# Patient Record
Sex: Male | Born: 1955 | Race: White | Hispanic: No | Smoking: Current every day smoker
Health system: Southern US, Community
[De-identification: ages and names within clinical notes are randomized; demographics above are authoritative.]

## PROBLEM LIST (undated history)

## (undated) DIAGNOSIS — F1910 Other psychoactive substance abuse, uncomplicated: Secondary | ICD-10-CM

## (undated) DIAGNOSIS — F329 Major depressive disorder, single episode, unspecified: Secondary | ICD-10-CM

## (undated) DIAGNOSIS — E785 Hyperlipidemia, unspecified: Secondary | ICD-10-CM

## (undated) DIAGNOSIS — K769 Liver disease, unspecified: Secondary | ICD-10-CM

## (undated) DIAGNOSIS — F1410 Cocaine abuse, uncomplicated: Secondary | ICD-10-CM

## (undated) DIAGNOSIS — I259 Chronic ischemic heart disease, unspecified: Secondary | ICD-10-CM

## (undated) DIAGNOSIS — F419 Anxiety disorder, unspecified: Secondary | ICD-10-CM

## (undated) DIAGNOSIS — Z765 Malingerer [conscious simulation]: Secondary | ICD-10-CM

## (undated) HISTORY — PX: PELVIS CLOSED REDUCTION: SHX1023

## (undated) HISTORY — PX: INNER EAR SURGERY: SHX679

## (undated) HISTORY — PX: WRIST SURGERY: SHX841

## (undated) HISTORY — PX: APPENDECTOMY: SHX54

## (undated) HISTORY — DX: Anxiety disorder, unspecified: F41.9

## (undated) HISTORY — PX: ABDOMINAL SURGERY: SHX537

## (undated) HISTORY — PX: HIP SURGERY: SHX245

---

## 2000-06-01 ENCOUNTER — Emergency Department (HOSPITAL_COMMUNITY): Admission: EM | Admit: 2000-06-01 | Discharge: 2000-06-01 | Payer: Self-pay | Admitting: Unknown Physician Specialty

## 2004-12-03 ENCOUNTER — Ambulatory Visit: Payer: Self-pay | Admitting: Family Medicine

## 2004-12-24 ENCOUNTER — Ambulatory Visit: Payer: Self-pay | Admitting: Family Medicine

## 2005-01-08 ENCOUNTER — Ambulatory Visit: Payer: Self-pay | Admitting: Family Medicine

## 2005-01-18 ENCOUNTER — Ambulatory Visit: Payer: Self-pay | Admitting: Family Medicine

## 2005-02-16 ENCOUNTER — Ambulatory Visit: Payer: Self-pay | Admitting: Family Medicine

## 2005-04-07 ENCOUNTER — Ambulatory Visit: Payer: Self-pay | Admitting: Family Medicine

## 2005-04-21 ENCOUNTER — Ambulatory Visit: Payer: Self-pay | Admitting: Family Medicine

## 2005-05-04 ENCOUNTER — Ambulatory Visit: Payer: Self-pay | Admitting: Family Medicine

## 2005-06-15 ENCOUNTER — Ambulatory Visit: Payer: Self-pay | Admitting: Family Medicine

## 2005-08-16 ENCOUNTER — Ambulatory Visit: Payer: Self-pay | Admitting: Family Medicine

## 2005-08-26 ENCOUNTER — Ambulatory Visit: Payer: Self-pay | Admitting: Family Medicine

## 2005-09-09 ENCOUNTER — Ambulatory Visit: Payer: Self-pay | Admitting: Family Medicine

## 2005-11-17 ENCOUNTER — Ambulatory Visit: Payer: Self-pay | Admitting: Family Medicine

## 2005-12-06 ENCOUNTER — Ambulatory Visit: Payer: Self-pay | Admitting: Family Medicine

## 2006-01-04 DIAGNOSIS — F17200 Nicotine dependence, unspecified, uncomplicated: Secondary | ICD-10-CM

## 2006-01-04 DIAGNOSIS — F259 Schizoaffective disorder, unspecified: Secondary | ICD-10-CM | POA: Insufficient documentation

## 2006-01-04 DIAGNOSIS — K5730 Diverticulosis of large intestine without perforation or abscess without bleeding: Secondary | ICD-10-CM | POA: Insufficient documentation

## 2006-01-04 DIAGNOSIS — K219 Gastro-esophageal reflux disease without esophagitis: Secondary | ICD-10-CM

## 2006-01-04 DIAGNOSIS — B1710 Acute hepatitis C without hepatic coma: Secondary | ICD-10-CM | POA: Insufficient documentation

## 2006-01-04 DIAGNOSIS — M479 Spondylosis, unspecified: Secondary | ICD-10-CM | POA: Insufficient documentation

## 2006-01-04 DIAGNOSIS — F29 Unspecified psychosis not due to a substance or known physiological condition: Secondary | ICD-10-CM | POA: Insufficient documentation

## 2006-01-04 DIAGNOSIS — F411 Generalized anxiety disorder: Secondary | ICD-10-CM | POA: Insufficient documentation

## 2006-01-04 DIAGNOSIS — E7800 Pure hypercholesterolemia, unspecified: Secondary | ICD-10-CM | POA: Insufficient documentation

## 2006-01-12 ENCOUNTER — Ambulatory Visit (HOSPITAL_COMMUNITY): Payer: Self-pay | Admitting: Psychiatry

## 2006-01-12 ENCOUNTER — Ambulatory Visit: Payer: Self-pay | Admitting: Family Medicine

## 2006-01-26 ENCOUNTER — Encounter: Payer: Self-pay | Admitting: Family Medicine

## 2006-02-03 ENCOUNTER — Encounter: Payer: Self-pay | Admitting: Family Medicine

## 2006-02-07 ENCOUNTER — Encounter: Payer: Self-pay | Admitting: Family Medicine

## 2006-02-24 ENCOUNTER — Encounter: Payer: Self-pay | Admitting: Family Medicine

## 2006-03-03 ENCOUNTER — Ambulatory Visit: Payer: Self-pay | Admitting: Family Medicine

## 2006-03-03 LAB — CONVERTED CEMR LAB: Blood Glucose, Fasting: 140 mg/dL

## 2006-04-01 ENCOUNTER — Ambulatory Visit: Payer: Self-pay | Admitting: Family Medicine

## 2006-04-01 LAB — CONVERTED CEMR LAB: Blood Glucose, Fasting: 110 mg/dL

## 2006-04-06 ENCOUNTER — Ambulatory Visit (HOSPITAL_COMMUNITY): Payer: Self-pay | Admitting: Psychiatry

## 2006-05-06 ENCOUNTER — Ambulatory Visit: Payer: Self-pay | Admitting: Family Medicine

## 2006-05-11 ENCOUNTER — Ambulatory Visit: Payer: Self-pay | Admitting: Family Medicine

## 2006-05-19 ENCOUNTER — Ambulatory Visit: Payer: Self-pay | Admitting: Family Medicine

## 2006-06-01 ENCOUNTER — Ambulatory Visit: Payer: Self-pay | Admitting: Family Medicine

## 2006-06-01 DIAGNOSIS — R498 Other voice and resonance disorders: Secondary | ICD-10-CM

## 2006-06-02 LAB — CONVERTED CEMR LAB
ALT: 30 units/L (ref 0–53)
AST: 25 units/L (ref 0–37)
Alkaline Phosphatase: 119 units/L — ABNORMAL HIGH (ref 39–117)
LDL Cholesterol: 95 mg/dL (ref 0–99)
MCHC: 31.2 g/dL (ref 30.0–36.0)
MCV: 95 fL (ref 78.0–100.0)
Platelets: 287 10*3/uL (ref 150–400)
Sodium: 139 meq/L (ref 135–145)
Total Bilirubin: 0.4 mg/dL (ref 0.3–1.2)
Total Protein: 7 g/dL (ref 6.0–8.3)
VLDL: 33 mg/dL (ref 0–40)

## 2006-06-08 ENCOUNTER — Telehealth: Payer: Self-pay | Admitting: Family Medicine

## 2006-06-16 ENCOUNTER — Encounter: Payer: Self-pay | Admitting: Family Medicine

## 2006-07-06 ENCOUNTER — Ambulatory Visit (HOSPITAL_COMMUNITY): Payer: Self-pay | Admitting: Psychiatry

## 2006-07-20 ENCOUNTER — Encounter: Payer: Self-pay | Admitting: Family Medicine

## 2006-07-26 ENCOUNTER — Ambulatory Visit: Payer: Self-pay | Admitting: Family Medicine

## 2006-08-05 ENCOUNTER — Ambulatory Visit (HOSPITAL_COMMUNITY): Payer: Self-pay | Admitting: Psychiatry

## 2006-08-29 ENCOUNTER — Encounter: Payer: Self-pay | Admitting: Family Medicine

## 2006-09-05 ENCOUNTER — Ambulatory Visit: Payer: Self-pay | Admitting: Family Medicine

## 2006-10-26 ENCOUNTER — Ambulatory Visit (HOSPITAL_COMMUNITY): Payer: Self-pay | Admitting: Psychiatry

## 2006-11-14 ENCOUNTER — Ambulatory Visit: Payer: Self-pay | Admitting: Family Medicine

## 2006-11-23 ENCOUNTER — Ambulatory Visit (HOSPITAL_COMMUNITY): Payer: Self-pay | Admitting: Psychiatry

## 2006-12-07 ENCOUNTER — Encounter: Payer: Self-pay | Admitting: Family Medicine

## 2006-12-13 ENCOUNTER — Ambulatory Visit: Payer: Self-pay | Admitting: Family Medicine

## 2006-12-14 ENCOUNTER — Encounter: Payer: Self-pay | Admitting: Family Medicine

## 2006-12-14 LAB — CONVERTED CEMR LAB
ALT: 11 units/L (ref 0–53)
AST: 12 units/L (ref 0–37)
Albumin: 4.3 g/dL (ref 3.5–5.2)
Alkaline Phosphatase: 118 units/L — ABNORMAL HIGH (ref 39–117)
BUN: 10 mg/dL (ref 6–23)
CO2: 19 meq/L (ref 19–32)
Calcium: 9.2 mg/dL (ref 8.4–10.5)
Chloride: 108 meq/L (ref 96–112)
Cholesterol: 140 mg/dL (ref 0–200)
Creatinine, Ser: 1.23 mg/dL (ref 0.40–1.50)
Glucose, Bld: 110 mg/dL — ABNORMAL HIGH (ref 70–99)
HCT: 40.9 % (ref 39.0–52.0)
HCV Quantitative: 80 intl units/mL — ABNORMAL HIGH (ref <5)
HDL: 29 mg/dL — ABNORMAL LOW (ref >39)
Hemoglobin: 13.5 g/dL (ref 13.0–17.0)
Hgb A1c MFr Bld: 5.9 % (ref 4.6–6.1)
LDL Cholesterol: 57 mg/dL (ref 0–99)
MCHC: 33 g/dL (ref 30.0–36.0)
MCV: 92.3 fL (ref 78.0–100.0)
Platelets: 226 10*3/uL (ref 150–400)
Potassium: 4 meq/L (ref 3.5–5.3)
RBC: 4.43 M/uL (ref 4.22–5.81)
RDW: 15.3 % — ABNORMAL HIGH (ref 11.5–14.0)
Sodium: 140 meq/L (ref 135–145)
Total Bilirubin: 0.3 mg/dL (ref 0.3–1.2)
Total CHOL/HDL Ratio: 4.8
Total Protein: 6.9 g/dL (ref 6.0–8.3)
Triglycerides: 271 mg/dL — ABNORMAL HIGH (ref <150)
VLDL: 54 mg/dL — ABNORMAL HIGH (ref 0–40)
WBC: 6 10*3/uL (ref 4.0–10.5)

## 2006-12-19 ENCOUNTER — Ambulatory Visit (HOSPITAL_COMMUNITY): Payer: Self-pay | Admitting: Psychiatry

## 2006-12-21 ENCOUNTER — Encounter: Payer: Self-pay | Admitting: Family Medicine

## 2006-12-29 ENCOUNTER — Ambulatory Visit: Payer: Self-pay | Admitting: Family Medicine

## 2007-01-24 ENCOUNTER — Ambulatory Visit (HOSPITAL_COMMUNITY): Payer: Self-pay | Admitting: Psychiatry

## 2007-02-07 ENCOUNTER — Ambulatory Visit: Payer: Self-pay | Admitting: Family Medicine

## 2007-02-27 ENCOUNTER — Ambulatory Visit (HOSPITAL_COMMUNITY): Payer: Self-pay | Admitting: Psychiatry

## 2007-04-06 ENCOUNTER — Ambulatory Visit (HOSPITAL_COMMUNITY): Payer: Self-pay | Admitting: Psychiatry

## 2007-04-07 ENCOUNTER — Encounter: Payer: Self-pay | Admitting: Family Medicine

## 2007-05-15 ENCOUNTER — Encounter: Admission: RE | Admit: 2007-05-15 | Discharge: 2007-05-15 | Payer: Self-pay | Admitting: Family Medicine

## 2007-05-15 ENCOUNTER — Ambulatory Visit: Payer: Self-pay | Admitting: Family Medicine

## 2007-05-15 LAB — CONVERTED CEMR LAB
Bilirubin Urine: NEGATIVE
Ketones, urine, test strip: NEGATIVE
Specific Gravity, Urine: 1.025
Urobilinogen, UA: 0.2
pH: 6

## 2007-05-16 ENCOUNTER — Encounter: Payer: Self-pay | Admitting: Family Medicine

## 2007-05-16 LAB — CONVERTED CEMR LAB
CO2: 17 meq/L — ABNORMAL LOW (ref 19–32)
Glucose, Bld: 74 mg/dL (ref 70–99)
Lymphocytes Relative: 38 % (ref 12–46)
Lymphs Abs: 5.8 10*3/uL — ABNORMAL HIGH (ref 0.7–4.0)
Monocytes Relative: 6 % (ref 3–12)
Neutro Abs: 8.5 10*3/uL — ABNORMAL HIGH (ref 1.7–7.7)
Neutrophils Relative %: 55 % (ref 43–77)
Potassium: 4.5 meq/L (ref 3.5–5.3)
RBC: 4.19 M/uL — ABNORMAL LOW (ref 4.22–5.81)
Sodium: 140 meq/L (ref 135–145)
WBC: 15.4 10*3/uL — ABNORMAL HIGH (ref 4.0–10.5)

## 2007-06-15 ENCOUNTER — Encounter: Payer: Self-pay | Admitting: Family Medicine

## 2007-06-16 ENCOUNTER — Ambulatory Visit (HOSPITAL_COMMUNITY): Payer: Self-pay | Admitting: Psychiatry

## 2007-06-16 ENCOUNTER — Telehealth: Payer: Self-pay | Admitting: Family Medicine

## 2007-06-20 ENCOUNTER — Encounter: Payer: Self-pay | Admitting: Family Medicine

## 2007-06-20 LAB — CONVERTED CEMR LAB
ALT: 19 units/L (ref 0–53)
AST: 19 units/L (ref 0–37)
HDL: 36 mg/dL — ABNORMAL LOW (ref >39)
Total CHOL/HDL Ratio: 7.2
VLDL: 77 mg/dL — ABNORMAL HIGH (ref 0–40)

## 2007-06-21 ENCOUNTER — Encounter: Payer: Self-pay | Admitting: Family Medicine

## 2007-06-26 ENCOUNTER — Ambulatory Visit: Payer: Self-pay | Admitting: Family Medicine

## 2007-06-29 ENCOUNTER — Encounter: Payer: Self-pay | Admitting: Family Medicine

## 2007-07-21 ENCOUNTER — Telehealth: Payer: Self-pay | Admitting: Family Medicine

## 2007-08-04 ENCOUNTER — Encounter: Payer: Self-pay | Admitting: Family Medicine

## 2007-08-07 ENCOUNTER — Telehealth: Payer: Self-pay | Admitting: Family Medicine

## 2007-08-08 ENCOUNTER — Telehealth: Payer: Self-pay | Admitting: Family Medicine

## 2007-08-09 ENCOUNTER — Telehealth: Payer: Self-pay | Admitting: Family Medicine

## 2007-08-10 ENCOUNTER — Encounter: Payer: Self-pay | Admitting: Family Medicine

## 2007-08-15 ENCOUNTER — Ambulatory Visit: Payer: Self-pay | Admitting: Family Medicine

## 2007-08-15 DIAGNOSIS — Z87898 Personal history of other specified conditions: Secondary | ICD-10-CM

## 2007-08-15 LAB — CONVERTED CEMR LAB
Blood in Urine, dipstick: NEGATIVE
Nitrite: NEGATIVE
Specific Gravity, Urine: >=1.03
Urobilinogen, UA: 0.2
WBC Urine, dipstick: NEGATIVE

## 2007-08-16 ENCOUNTER — Encounter: Payer: Self-pay | Admitting: Family Medicine

## 2007-08-16 LAB — CONVERTED CEMR LAB
CO2: 20 meq/L (ref 19–32)
Chloride: 105 meq/L (ref 96–112)
Creatinine, Ser: 1.05 mg/dL (ref 0.40–1.50)
PSA, Free Pct: 7 — ABNORMAL LOW (ref >25)
Potassium: 4.4 meq/L (ref 3.5–5.3)

## 2007-08-23 ENCOUNTER — Telehealth: Payer: Self-pay | Admitting: Family Medicine

## 2007-08-23 DIAGNOSIS — M79609 Pain in unspecified limb: Secondary | ICD-10-CM | POA: Insufficient documentation

## 2007-08-30 ENCOUNTER — Telehealth: Payer: Self-pay | Admitting: Family Medicine

## 2007-09-08 ENCOUNTER — Ambulatory Visit: Payer: Self-pay | Admitting: Physical Medicine & Rehabilitation

## 2007-09-13 ENCOUNTER — Ambulatory Visit (HOSPITAL_COMMUNITY): Payer: Self-pay | Admitting: Psychiatry

## 2007-09-15 ENCOUNTER — Encounter: Payer: Self-pay | Admitting: Family Medicine

## 2007-10-24 ENCOUNTER — Ambulatory Visit: Payer: Self-pay | Admitting: Family Medicine

## 2007-10-24 DIAGNOSIS — H9190 Unspecified hearing loss, unspecified ear: Secondary | ICD-10-CM | POA: Insufficient documentation

## 2007-10-31 ENCOUNTER — Encounter: Payer: Self-pay | Admitting: Family Medicine

## 2007-11-02 LAB — CONVERTED CEMR LAB
Glucose, Fasting: 83 mg/dL (ref 70–99)
HDL: 36 mg/dL — ABNORMAL LOW (ref >39)
LDL Cholesterol: 116 mg/dL — ABNORMAL HIGH (ref 0–99)
Total CHOL/HDL Ratio: 5.6
Triglycerides: 245 mg/dL — ABNORMAL HIGH (ref <150)
VLDL: 49 mg/dL — ABNORMAL HIGH (ref 0–40)

## 2007-11-06 ENCOUNTER — Telehealth (INDEPENDENT_AMBULATORY_CARE_PROVIDER_SITE_OTHER): Payer: Self-pay | Admitting: *Deleted

## 2007-11-07 ENCOUNTER — Telehealth (INDEPENDENT_AMBULATORY_CARE_PROVIDER_SITE_OTHER): Payer: Self-pay | Admitting: *Deleted

## 2007-11-09 ENCOUNTER — Encounter: Payer: Self-pay | Admitting: Family Medicine

## 2007-11-14 ENCOUNTER — Telehealth: Payer: Self-pay | Admitting: Family Medicine

## 2007-11-14 ENCOUNTER — Encounter: Payer: Self-pay | Admitting: Family Medicine

## 2007-11-16 ENCOUNTER — Ambulatory Visit: Payer: Self-pay | Admitting: Family Medicine

## 2007-11-16 ENCOUNTER — Encounter: Payer: Self-pay | Admitting: Family Medicine

## 2007-11-16 ENCOUNTER — Encounter: Admission: RE | Admit: 2007-11-16 | Discharge: 2007-11-16 | Payer: Self-pay | Admitting: Family Medicine

## 2007-11-16 DIAGNOSIS — R0602 Shortness of breath: Secondary | ICD-10-CM

## 2007-11-30 ENCOUNTER — Encounter: Payer: Self-pay | Admitting: Family Medicine

## 2007-12-01 ENCOUNTER — Encounter: Payer: Self-pay | Admitting: Family Medicine

## 2007-12-06 ENCOUNTER — Encounter: Payer: Self-pay | Admitting: Family Medicine

## 2007-12-13 ENCOUNTER — Ambulatory Visit (HOSPITAL_COMMUNITY): Payer: Self-pay | Admitting: Psychiatry

## 2007-12-21 ENCOUNTER — Ambulatory Visit: Payer: Self-pay | Admitting: Family Medicine

## 2007-12-22 ENCOUNTER — Telehealth: Payer: Self-pay | Admitting: Family Medicine

## 2008-01-05 ENCOUNTER — Ambulatory Visit: Payer: Self-pay | Admitting: Family Medicine

## 2008-01-05 DIAGNOSIS — F528 Other sexual dysfunction not due to a substance or known physiological condition: Secondary | ICD-10-CM | POA: Insufficient documentation

## 2008-01-08 ENCOUNTER — Telehealth: Payer: Self-pay | Admitting: Family Medicine

## 2008-01-12 ENCOUNTER — Telehealth: Payer: Self-pay | Admitting: Family Medicine

## 2008-01-12 ENCOUNTER — Encounter: Payer: Self-pay | Admitting: Family Medicine

## 2008-01-29 ENCOUNTER — Ambulatory Visit: Payer: Self-pay | Admitting: Family Medicine

## 2008-02-08 ENCOUNTER — Ambulatory Visit: Payer: Self-pay | Admitting: Family Medicine

## 2008-03-01 ENCOUNTER — Encounter: Payer: Self-pay | Admitting: Family Medicine

## 2008-03-08 ENCOUNTER — Ambulatory Visit (HOSPITAL_COMMUNITY): Payer: Self-pay | Admitting: Psychiatry

## 2008-04-03 ENCOUNTER — Ambulatory Visit (HOSPITAL_COMMUNITY): Payer: Self-pay | Admitting: Psychiatry

## 2008-04-03 ENCOUNTER — Telehealth: Payer: Self-pay | Admitting: Family Medicine

## 2008-05-26 ENCOUNTER — Emergency Department (HOSPITAL_BASED_OUTPATIENT_CLINIC_OR_DEPARTMENT_OTHER): Admission: EM | Admit: 2008-05-26 | Discharge: 2008-05-26 | Payer: Self-pay | Admitting: Emergency Medicine

## 2008-05-29 ENCOUNTER — Ambulatory Visit: Payer: Self-pay | Admitting: Family Medicine

## 2008-05-29 DIAGNOSIS — R7301 Impaired fasting glucose: Secondary | ICD-10-CM

## 2008-05-29 LAB — CONVERTED CEMR LAB

## 2008-06-05 ENCOUNTER — Encounter: Payer: Self-pay | Admitting: Family Medicine

## 2008-06-07 ENCOUNTER — Ambulatory Visit (HOSPITAL_COMMUNITY): Payer: Self-pay | Admitting: Psychiatry

## 2008-06-24 ENCOUNTER — Encounter: Payer: Self-pay | Admitting: Family Medicine

## 2008-08-29 ENCOUNTER — Telehealth: Payer: Self-pay | Admitting: Family Medicine

## 2008-08-29 ENCOUNTER — Ambulatory Visit: Payer: Self-pay | Admitting: Family Medicine

## 2008-08-29 DIAGNOSIS — K3189 Other diseases of stomach and duodenum: Secondary | ICD-10-CM

## 2008-08-29 DIAGNOSIS — R1013 Epigastric pain: Secondary | ICD-10-CM

## 2008-08-29 LAB — CONVERTED CEMR LAB: Blood Glucose, Fingerstick: 92

## 2008-08-30 LAB — CONVERTED CEMR LAB
ALT: 24 units/L (ref 0–53)
Albumin: 4.2 g/dL (ref 3.5–5.2)
CO2: 21 meq/L (ref 19–32)
Calcium: 9.2 mg/dL (ref 8.4–10.5)
Chloride: 104 meq/L (ref 96–112)
Glucose, Bld: 95 mg/dL (ref 70–99)
Potassium: 4.2 meq/L (ref 3.5–5.3)
Sodium: 137 meq/L (ref 135–145)
Total Protein: 6.8 g/dL (ref 6.0–8.3)

## 2008-09-13 ENCOUNTER — Ambulatory Visit (HOSPITAL_COMMUNITY): Payer: Self-pay | Admitting: Psychiatry

## 2008-09-24 ENCOUNTER — Ambulatory Visit: Payer: Self-pay | Admitting: Family Medicine

## 2008-09-24 DIAGNOSIS — D649 Anemia, unspecified: Secondary | ICD-10-CM | POA: Insufficient documentation

## 2008-09-24 LAB — CONVERTED CEMR LAB: Blood Glucose, AC Bkfst: 151 mg/dL

## 2008-09-25 LAB — CONVERTED CEMR LAB
Hemoglobin: 12.6 g/dL — ABNORMAL LOW (ref 13.0–17.0)
MCHC: 32.6 g/dL (ref 30.0–36.0)
Platelets: 326 10*3/uL (ref 150–400)
RDW: 15.7 % — ABNORMAL HIGH (ref 11.5–15.5)

## 2008-10-02 ENCOUNTER — Telehealth: Payer: Self-pay | Admitting: Family Medicine

## 2008-11-08 ENCOUNTER — Ambulatory Visit: Payer: Self-pay | Admitting: Family Medicine

## 2008-11-08 DIAGNOSIS — B009 Herpesviral infection, unspecified: Secondary | ICD-10-CM | POA: Insufficient documentation

## 2008-11-21 ENCOUNTER — Encounter: Admission: RE | Admit: 2008-11-21 | Discharge: 2008-11-21 | Payer: Self-pay | Admitting: Family Medicine

## 2008-11-21 ENCOUNTER — Ambulatory Visit: Payer: Self-pay | Admitting: Family Medicine

## 2008-11-21 DIAGNOSIS — M25569 Pain in unspecified knee: Secondary | ICD-10-CM

## 2008-11-25 ENCOUNTER — Telehealth: Payer: Self-pay | Admitting: Family Medicine

## 2008-12-08 ENCOUNTER — Encounter: Payer: Self-pay | Admitting: Family Medicine

## 2008-12-13 ENCOUNTER — Ambulatory Visit (HOSPITAL_COMMUNITY): Payer: Self-pay | Admitting: Psychiatry

## 2009-01-13 ENCOUNTER — Emergency Department (HOSPITAL_BASED_OUTPATIENT_CLINIC_OR_DEPARTMENT_OTHER): Admission: EM | Admit: 2009-01-13 | Discharge: 2009-01-13 | Payer: Self-pay | Admitting: Emergency Medicine

## 2009-01-13 ENCOUNTER — Ambulatory Visit: Payer: Self-pay | Admitting: Occupational Medicine

## 2009-01-13 ENCOUNTER — Ambulatory Visit: Payer: Self-pay | Admitting: Radiology

## 2009-01-13 ENCOUNTER — Ambulatory Visit (HOSPITAL_COMMUNITY): Payer: Self-pay | Admitting: Psychiatry

## 2009-01-16 ENCOUNTER — Telehealth (INDEPENDENT_AMBULATORY_CARE_PROVIDER_SITE_OTHER): Payer: Self-pay | Admitting: *Deleted

## 2009-02-14 ENCOUNTER — Ambulatory Visit: Payer: Self-pay | Admitting: Family Medicine

## 2009-02-14 DIAGNOSIS — I959 Hypotension, unspecified: Secondary | ICD-10-CM | POA: Insufficient documentation

## 2009-02-18 ENCOUNTER — Ambulatory Visit: Payer: Self-pay | Admitting: Family Medicine

## 2009-02-18 DIAGNOSIS — S62639A Displaced fracture of distal phalanx of unspecified finger, initial encounter for closed fracture: Secondary | ICD-10-CM

## 2009-02-25 ENCOUNTER — Encounter: Payer: Self-pay | Admitting: Family Medicine

## 2009-02-26 LAB — CONVERTED CEMR LAB
ALT: 20 units/L (ref 0–53)
Albumin: 4.4 g/dL (ref 3.5–5.2)
Alkaline Phosphatase: 73 units/L (ref 39–117)
CO2: 22 meq/L (ref 19–32)
Cholesterol: 159 mg/dL (ref 0–200)
Glucose, Bld: 98 mg/dL (ref 70–99)
Hgb A1c MFr Bld: 5.9 % (ref 4.6–6.1)
LDL Cholesterol: 97 mg/dL (ref 0–99)
MCV: 95.1 fL (ref 78.0–100.0)
Platelets: 250 10*3/uL (ref 150–400)
Potassium: 4.1 meq/L (ref 3.5–5.3)
RBC: 4.26 M/uL (ref 4.22–5.81)
Sodium: 140 meq/L (ref 135–145)
Total Bilirubin: 0.4 mg/dL (ref 0.3–1.2)
Total Protein: 6.8 g/dL (ref 6.0–8.3)
VLDL: 23 mg/dL (ref 0–40)
WBC: 10.4 10*3/uL (ref 4.0–10.5)

## 2009-03-07 ENCOUNTER — Ambulatory Visit (HOSPITAL_COMMUNITY): Payer: Self-pay | Admitting: Psychiatry

## 2009-03-20 ENCOUNTER — Emergency Department (HOSPITAL_BASED_OUTPATIENT_CLINIC_OR_DEPARTMENT_OTHER): Admission: EM | Admit: 2009-03-20 | Discharge: 2009-03-20 | Payer: Self-pay | Admitting: Emergency Medicine

## 2009-03-20 ENCOUNTER — Ambulatory Visit: Payer: Self-pay | Admitting: Diagnostic Radiology

## 2009-03-23 ENCOUNTER — Emergency Department (HOSPITAL_BASED_OUTPATIENT_CLINIC_OR_DEPARTMENT_OTHER): Admission: EM | Admit: 2009-03-23 | Discharge: 2009-03-23 | Payer: Self-pay | Admitting: Emergency Medicine

## 2009-03-24 ENCOUNTER — Telehealth (INDEPENDENT_AMBULATORY_CARE_PROVIDER_SITE_OTHER): Payer: Self-pay | Admitting: *Deleted

## 2009-03-24 ENCOUNTER — Emergency Department (HOSPITAL_BASED_OUTPATIENT_CLINIC_OR_DEPARTMENT_OTHER): Admission: EM | Admit: 2009-03-24 | Discharge: 2009-03-24 | Payer: Self-pay | Admitting: Emergency Medicine

## 2009-03-26 ENCOUNTER — Ambulatory Visit: Payer: Self-pay | Admitting: Family Medicine

## 2009-03-26 DIAGNOSIS — R112 Nausea with vomiting, unspecified: Secondary | ICD-10-CM

## 2009-03-27 LAB — CONVERTED CEMR LAB
ALT: 18 units/L (ref 0–53)
AST: 18 units/L (ref 0–37)
Alkaline Phosphatase: 98 units/L (ref 39–117)
Basophils Absolute: 0 10*3/uL (ref 0.0–0.1)
Basophils Relative: 0 % (ref 0–1)
Glucose, Bld: 95 mg/dL (ref 70–99)
Lipase: 32 units/L (ref 0–75)
Lymphocytes Relative: 26 % (ref 12–46)
MCHC: 33.4 g/dL (ref 30.0–36.0)
Neutro Abs: 6.8 10*3/uL (ref 1.7–7.7)
Neutrophils Relative %: 65 % (ref 43–77)
RDW: 13.9 % (ref 11.5–15.5)
Sodium: 139 meq/L (ref 135–145)
Total Bilirubin: 0.4 mg/dL (ref 0.3–1.2)
Total Protein: 6.5 g/dL (ref 6.0–8.3)

## 2009-04-03 ENCOUNTER — Telehealth: Payer: Self-pay | Admitting: Family Medicine

## 2009-04-03 DIAGNOSIS — L600 Ingrowing nail: Secondary | ICD-10-CM | POA: Insufficient documentation

## 2009-04-16 ENCOUNTER — Ambulatory Visit (HOSPITAL_COMMUNITY): Payer: Self-pay | Admitting: Psychiatry

## 2009-04-30 ENCOUNTER — Ambulatory Visit: Payer: Self-pay | Admitting: Family Medicine

## 2009-04-30 DIAGNOSIS — G4700 Insomnia, unspecified: Secondary | ICD-10-CM

## 2009-05-01 ENCOUNTER — Telehealth (INDEPENDENT_AMBULATORY_CARE_PROVIDER_SITE_OTHER): Payer: Self-pay | Admitting: *Deleted

## 2009-05-01 ENCOUNTER — Telehealth: Payer: Self-pay | Admitting: Family Medicine

## 2009-05-18 ENCOUNTER — Emergency Department (HOSPITAL_BASED_OUTPATIENT_CLINIC_OR_DEPARTMENT_OTHER): Admission: EM | Admit: 2009-05-18 | Discharge: 2009-05-19 | Payer: Self-pay | Admitting: Emergency Medicine

## 2009-05-18 ENCOUNTER — Ambulatory Visit: Payer: Self-pay | Admitting: Diagnostic Radiology

## 2009-05-19 ENCOUNTER — Ambulatory Visit: Payer: Self-pay | Admitting: Diagnostic Radiology

## 2009-05-25 ENCOUNTER — Emergency Department (HOSPITAL_BASED_OUTPATIENT_CLINIC_OR_DEPARTMENT_OTHER): Admission: EM | Admit: 2009-05-25 | Discharge: 2009-05-25 | Payer: Self-pay | Admitting: Emergency Medicine

## 2009-05-25 ENCOUNTER — Ambulatory Visit: Payer: Self-pay | Admitting: Diagnostic Radiology

## 2009-05-27 ENCOUNTER — Emergency Department (HOSPITAL_BASED_OUTPATIENT_CLINIC_OR_DEPARTMENT_OTHER): Admission: EM | Admit: 2009-05-27 | Discharge: 2009-05-28 | Payer: Self-pay | Admitting: Emergency Medicine

## 2009-05-27 ENCOUNTER — Telehealth (INDEPENDENT_AMBULATORY_CARE_PROVIDER_SITE_OTHER): Payer: Self-pay | Admitting: *Deleted

## 2009-05-28 ENCOUNTER — Ambulatory Visit: Payer: Self-pay | Admitting: Diagnostic Radiology

## 2009-06-06 ENCOUNTER — Telehealth: Payer: Self-pay | Admitting: Family Medicine

## 2009-06-06 ENCOUNTER — Ambulatory Visit: Payer: Self-pay | Admitting: Family Medicine

## 2009-06-08 ENCOUNTER — Emergency Department (HOSPITAL_BASED_OUTPATIENT_CLINIC_OR_DEPARTMENT_OTHER): Admission: EM | Admit: 2009-06-08 | Discharge: 2009-06-08 | Payer: Self-pay | Admitting: Emergency Medicine

## 2009-06-08 ENCOUNTER — Emergency Department (HOSPITAL_COMMUNITY): Admission: EM | Admit: 2009-06-08 | Discharge: 2009-06-08 | Payer: Self-pay | Admitting: Emergency Medicine

## 2009-06-10 ENCOUNTER — Telehealth (INDEPENDENT_AMBULATORY_CARE_PROVIDER_SITE_OTHER): Payer: Self-pay | Admitting: *Deleted

## 2009-06-12 ENCOUNTER — Ambulatory Visit (HOSPITAL_COMMUNITY): Payer: Self-pay | Admitting: Psychiatry

## 2009-06-16 ENCOUNTER — Encounter: Payer: Self-pay | Admitting: Family Medicine

## 2009-06-20 ENCOUNTER — Telehealth (INDEPENDENT_AMBULATORY_CARE_PROVIDER_SITE_OTHER): Payer: Self-pay | Admitting: *Deleted

## 2009-06-23 ENCOUNTER — Encounter: Payer: Self-pay | Admitting: Family Medicine

## 2009-07-22 ENCOUNTER — Ambulatory Visit: Payer: Self-pay | Admitting: Family Medicine

## 2009-07-22 ENCOUNTER — Telehealth: Payer: Self-pay | Admitting: Family Medicine

## 2009-07-22 ENCOUNTER — Ambulatory Visit (HOSPITAL_COMMUNITY): Payer: Self-pay | Admitting: Psychiatry

## 2009-07-22 LAB — CONVERTED CEMR LAB
Albumin: 4.4 g/dL (ref 3.5–5.2)
Basophils Relative: 0 % (ref 0–1)
CO2: 18 meq/L — ABNORMAL LOW (ref 19–32)
Eosinophils Relative: 1 % (ref 0–5)
Ethyl Alcohol: <10 mg/dL (ref <10)
Glucose, Bld: 113 mg/dL — ABNORMAL HIGH (ref 70–99)
HCT: 39.8 % (ref 39.0–52.0)
Hemoglobin: 13.5 g/dL (ref 13.0–17.0)
MCHC: 33.9 g/dL (ref 30.0–36.0)
Monocytes Absolute: 0.4 10*3/uL (ref 0.1–1.0)
Monocytes Relative: 6 % (ref 3–12)
Neutro Abs: 4.8 10*3/uL (ref 1.7–7.7)
Potassium: 4.1 meq/L (ref 3.5–5.3)
RBC: 4.35 M/uL (ref 4.22–5.81)
Sodium: 139 meq/L (ref 135–145)
Total Bilirubin: 0.7 mg/dL (ref 0.3–1.2)
Total Protein: 6.8 g/dL (ref 6.0–8.3)

## 2009-08-04 ENCOUNTER — Encounter: Payer: Self-pay | Admitting: Family Medicine

## 2009-08-11 ENCOUNTER — Telehealth (INDEPENDENT_AMBULATORY_CARE_PROVIDER_SITE_OTHER): Payer: Self-pay | Admitting: *Deleted

## 2009-08-13 ENCOUNTER — Telehealth (INDEPENDENT_AMBULATORY_CARE_PROVIDER_SITE_OTHER): Payer: Self-pay | Admitting: *Deleted

## 2009-08-18 ENCOUNTER — Telehealth (INDEPENDENT_AMBULATORY_CARE_PROVIDER_SITE_OTHER): Payer: Self-pay | Admitting: *Deleted

## 2009-08-28 ENCOUNTER — Telehealth (INDEPENDENT_AMBULATORY_CARE_PROVIDER_SITE_OTHER): Payer: Self-pay | Admitting: *Deleted

## 2009-09-03 ENCOUNTER — Telehealth: Payer: Self-pay | Admitting: Family Medicine

## 2009-09-04 ENCOUNTER — Encounter: Payer: Self-pay | Admitting: Family Medicine

## 2009-09-10 ENCOUNTER — Telehealth (INDEPENDENT_AMBULATORY_CARE_PROVIDER_SITE_OTHER): Payer: Self-pay | Admitting: *Deleted

## 2009-09-24 ENCOUNTER — Telehealth: Payer: Self-pay | Admitting: Family Medicine

## 2009-10-01 ENCOUNTER — Encounter: Payer: Self-pay | Admitting: Family Medicine

## 2009-10-11 ENCOUNTER — Encounter: Payer: Self-pay | Admitting: Family Medicine

## 2009-10-19 ENCOUNTER — Emergency Department (HOSPITAL_BASED_OUTPATIENT_CLINIC_OR_DEPARTMENT_OTHER): Admission: EM | Admit: 2009-10-19 | Discharge: 2009-10-20 | Payer: Self-pay | Admitting: Emergency Medicine

## 2009-10-20 ENCOUNTER — Telehealth: Payer: Self-pay | Admitting: Family Medicine

## 2009-10-20 ENCOUNTER — Emergency Department (HOSPITAL_BASED_OUTPATIENT_CLINIC_OR_DEPARTMENT_OTHER): Admission: EM | Admit: 2009-10-20 | Discharge: 2009-10-20 | Payer: Self-pay | Admitting: Emergency Medicine

## 2009-10-20 ENCOUNTER — Ambulatory Visit: Payer: Self-pay | Admitting: Diagnostic Radiology

## 2009-10-20 ENCOUNTER — Encounter (INDEPENDENT_AMBULATORY_CARE_PROVIDER_SITE_OTHER): Payer: Self-pay

## 2009-10-23 ENCOUNTER — Ambulatory Visit: Payer: Self-pay | Admitting: Diagnostic Radiology

## 2009-10-23 ENCOUNTER — Emergency Department (HOSPITAL_BASED_OUTPATIENT_CLINIC_OR_DEPARTMENT_OTHER): Admission: EM | Admit: 2009-10-23 | Discharge: 2009-10-23 | Payer: Self-pay | Admitting: Emergency Medicine

## 2009-10-27 ENCOUNTER — Telehealth: Payer: Self-pay | Admitting: Family Medicine

## 2009-10-27 ENCOUNTER — Emergency Department (HOSPITAL_BASED_OUTPATIENT_CLINIC_OR_DEPARTMENT_OTHER): Admission: EM | Admit: 2009-10-27 | Discharge: 2009-10-27 | Payer: Self-pay | Admitting: Emergency Medicine

## 2009-10-27 ENCOUNTER — Ambulatory Visit: Payer: Self-pay | Admitting: Interventional Radiology

## 2009-10-28 ENCOUNTER — Encounter: Payer: Self-pay | Admitting: Family Medicine

## 2009-11-03 ENCOUNTER — Ambulatory Visit (HOSPITAL_COMMUNITY): Payer: Self-pay | Admitting: Psychiatry

## 2009-11-13 ENCOUNTER — Emergency Department (HOSPITAL_BASED_OUTPATIENT_CLINIC_OR_DEPARTMENT_OTHER): Admission: EM | Admit: 2009-11-13 | Discharge: 2009-11-13 | Payer: Self-pay | Admitting: Obstetrics and Gynecology

## 2010-02-12 ENCOUNTER — Ambulatory Visit (HOSPITAL_COMMUNITY): Payer: Self-pay | Admitting: Psychiatry

## 2010-03-15 ENCOUNTER — Emergency Department (HOSPITAL_BASED_OUTPATIENT_CLINIC_OR_DEPARTMENT_OTHER)
Admission: EM | Admit: 2010-03-15 | Discharge: 2010-03-15 | Payer: Self-pay | Source: Home / Self Care | Admitting: Emergency Medicine

## 2010-04-15 ENCOUNTER — Ambulatory Visit (HOSPITAL_COMMUNITY): Admit: 2010-04-15 | Payer: Self-pay | Admitting: Psychiatry

## 2010-04-28 NOTE — Letter (Signed)
Summary: [facility]  [facility]   Imported By: Lanelle Bal 11/21/2009 12:07:51  _____________________________________________________________________  External Attachment:    Type:   Image     Comment:   External Document

## 2010-04-28 NOTE — Progress Notes (Signed)
Summary: Triage Call, Call pt back .Marland Kitchen...  Phone Note Call from Patient   Caller: Patient Summary of Call: Please call pt. [phone], He has a appt for Monday with Dr.Tabori for a sore Bump On his tongue, but he called this A.M. and feels like he can not wait until Monday. He is thinking it should be cultered or something. He would like for it to be looked at today. I informed him, as of right now Dr.Amy Belloso has no openings for today but I would send this message to the nurse/dr..... Thanks, Victorino Dike Initial call taken by: Michaelle Copas,  September 03, 2009 8:38 AM  Follow-up for Phone Call        Pt sees ENT - Dr Clearance Coots here in [place].  Let's see if we can get him in there this wk.  We do not biopsy tongues here and is has many risk factors for oral cancer. Follow-up by: Seymour Bars DO,  September 03, 2009 8:55 AM     Appended Document: Triage Call, Call pt back .Marland Kitchen... Pt scheduled tomorrow w/ Dr. Clearance Coots at [facility]. Pt aware.

## 2010-04-28 NOTE — Letter (Signed)
Summary: Discharge Letter/[facility]  Discharge Letter/[facility]   Imported By: Lanelle Bal 06/18/2009 14:19:02  _____________________________________________________________________  External Attachment:    Type:   Image     Comment:   External Document

## 2010-04-28 NOTE — Progress Notes (Signed)
Summary: Oxycodone refills  Phone Note Call from Patient   Refills Requested: Medication #1:  OXYCODONE HCL 10 MG TABS 1 tab by mouth three times a day as needed pain. Initial call taken by: Payton Spark CMA,  August 28, 2009 1:27 PM Caller: Patient Summary of Call: Dr.Bowen  Call Back  [phone](cell)  Patient said he needs to speack to the nurse about his meds Initial call taken by: Vanessa Swazil[place],  August 28, 2009 10:10 AM  Follow-up for Phone Call        Pt would like to pick up Rx tomorrow. He also states that sometimes he has to take 1 tab three times a day and 60 doesn't last all month. I explained to Pt that it is written for three times a day  as needed. Also explained to Pt that he will not be able to get early refills nor quantity increased. Pt voiced understanding. Pt then said that he has also been using some old pain patches he had left over from pain management. I explained to Pt that he is on a contract and should not be using any pain meds other than what Dr. Leonard Schwartz Rxd. Pt then said that he doesn't have any patches left and will not use anyother pain meds.  Follow-up by: Payton Spark CMA,  August 28, 2009 1:38 PM  Additional Follow-up for Phone Call Additional follow up Details #1::        He can pick up #60 Oxycodone today.  I am not increasing his quantity.  This is his first warning that he should only be taking the narcotics that I prescribe.  If he breaches his contract, he will be discharged.   Additional Follow-up by: Seymour Bars DO,  August 29, 2009 7:55 AM    Additional Follow-up for Phone Call Additional follow up Details #2::    Rx left at front desk Follow-up by: Payton Spark CMA,  August 29, 2009 10:19 AM  Prescriptions: OXYCODONE HCL 10 MG TABS (OXYCODONE HCL) 1 tab by mouth three times a day as needed pain  #60 x 0   Entered and Authorized by:   Seymour Bars DO   Signed by:   Seymour Bars DO on 08/29/2009   Method used:   Print then Give to Patient   RxID:    [id number]
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
## 2010-04-28 NOTE — Progress Notes (Signed)
Summary: Oxycodone refill  Phone Note Refill Request   Refills Requested: Medication #1:  OXYCODONE HCL 10 MG TABS 1 tab by mouth three times a day as needed pain Initial call taken by: Payton Spark CMA,  October 27, 2009 10:20 AM  Follow-up for Phone Call        Declined.  Pt has had multiple ED visits for narcotics.  I will no longer prescribe for him. Follow-up by: Seymour Bars DO,  October 27, 2009 4:56 PM

## 2010-04-28 NOTE — Progress Notes (Signed)
Summary: RE SLEEP /  Phone Note From Other Clinic   Caller: DR [facility] Summary of Call: DR Christell Constant LEFT MSG IN REF TO PT, SHE SAW HIM 2 WEEKS AGO ON THE 19TH NO PROBLEMS WITH SLEEP THEN  (?) ABOUT 2 MOS OF NOT SLEEPING DO NOT KNOW WHAT THAT IS ABOUT, AT OV i DID CUT BACK ON HIS SEROQUEL, IF PT IS HAVING PROBLEM, HE NEEDS TO CALL MY OFFICE  Initial call taken by: Kandice Hams,  May 01, 2009 12:13 PM  Follow-up for Phone Call        Pls let pt know to call Dr Christell Constant directly.  That was her message back in reguards to his sleeping.  His call back # is [phone] Follow-up by: Seymour Bars DO,  May 01, 2009 12:16 PM     Appended Document: RE SLEEP / Spoke wit pt informed pt Dr Christell Constant wants him to call her directly, pt agreed

## 2010-04-28 NOTE — Progress Notes (Signed)
Summary: Switch his pharmacy in our system  Phone Note Call from Patient   Caller: Patient Summary of Call: Pt. called and states that he would like for [place] to change his pharmacy in our system from [facility] back to [facility] [address] in [place] Initial call taken by: Michaelle Copas,  Aug 18, 2009 11:04 AM  Follow-up for Phone Call        done Follow-up by: Payton Spark CMA,  Aug 18, 2009 11:19 AM

## 2010-04-28 NOTE — Miscellaneous (Signed)
Summary: Comprehensive Med Review  Clinical Lists Changes  Comprehensive Med Review  Dr. Cathey Endow I had the pleasure of meeting with [name] on Monday, October 20, 2009 to enroll into the research study regarding a comprehensive medication review.  He stated he was doing ok.  He had just had hernia surgery and was having some nausea and vomiting.  He felt like it was do to his Seroquel because it tasted like the Seroquel when he vomitted.  He smokes one pack of cigarrettes every 3 days.  He stated he is going to quit cold [place].  He did not bring his medications with him today.  He lists his medications off to me.  Med list: Lipitor 80 mg once daily Nexium 40 mg once daily Combivent 1 puff QID as needed  Valtrex 500 mg once daily Oxycodone 10 mg three times a day as needed Ondansetron 8 mg q4-6 hr as needed Seroquel 100 mg once daily  Assessment/Plan  Jared Rojas had to leave earlier from the appointment due to his ride leaving.  He did not have any issues with cost of his medications.  He only needed a refill of the Ondansetron 8 mg which you were able to refill.  I spoke with him about help with smoking cessation and he said the only way to quit is to go cold [place].  I instructed him that if needed we could offer smoking cessation counceling.  He decline.  I will most likely see him back in 6 months to review his medications again.  Jared Rojas, PharmD Clinical Pharmacist (contracted) [facility]  Medications: Removed medication of CYMBALTA 60 MG CPEP (DULOXETINE HCL) TAKE TWO BY MOUTH DAILY IN AM

## 2010-04-28 NOTE — Progress Notes (Signed)
Summary: Drug screen  Phone Note Call from Patient   Caller: Patient Summary of Call: Pt states he did all labs and wanted to call back and be honest- Pt states he does smoke marijuana to help w/ his pain.  Initial call taken by: Payton Spark CMA,  July 22, 2009 11:06 AM  Follow-up for Phone Call        if he comes back +, he will not get an RX. Follow-up by: Seymour Bars DO,  July 22, 2009 11:08 AM     Appended Document: Drug screen Pt aware of the above  Appended Document: Drug screen Pls ask pt if he has been taking his prescribed oxycodone.  Seymour Bars, D.O.  Appended Document: Drug screen No answer at the above #.Arvilla Market CMA, Michelle Jul 30, 2009 9:36 AM   Pt states he has not had any pain medication in about 1.5 months.Arvilla Market CMA, Michelle Jul 30, 2009 4:19 PM   I will RF limited quantity of pain meds as long as he signs a narcotic contract.  Seymour Bars, D.O. Prescriptions: OXYCODONE HCL 10 MG TABS (OXYCODONE HCL) 1 tab by mouth three times a day as needed pain  #60 x 0   Entered and Authorized by:   Seymour Bars DO   Signed by:   Seymour Bars DO on 07/30/2009   Method used:   Print then Give to Patient   RxID:   [id number]   Appended Document: Drug screen No answer at the above number.Arvilla Market CMA, Michelle Jul 30, 2009 4:49 PM   Pt aware of the above.Arvilla Market CMA, Michelle Jul 31, 2009 12:34 PM

## 2010-04-28 NOTE — Progress Notes (Signed)
Summary: question about mediication  Phone Note Call from Patient   Caller: Patient Summary of Call: Dr.Bowen   [phone]  Patient left a message on the nurse voice mail---question about medication Initial call taken by: Vanessa Swaziland,  May 01, 2009 12:58 PM  Follow-up for Phone Call        PT HAS BEEN INFORMED TO FOLLWUP WITH DR Christell Constant Follow-up by: Kandice Hams,  May 02, 2009 4:08 PM

## 2010-04-28 NOTE — Miscellaneous (Signed)
Summary: Controlled Substance Agreement/[place] [facility]  Controlled Substance Agreement/[place] [facility]   Imported By: Lanelle Bal 08/07/2009 11:39:56  _____________________________________________________________________  External Attachment:    Type:   Image     Comment:   External Document

## 2010-04-28 NOTE — Letter (Signed)
Summary: Unable to See Patient/[facility]  Unable to See Patient/[facility]   Imported By: Lanelle Bal 06/27/2009 14:06:41  _____________________________________________________________________  External Attachment:    Type:   Image     Comment:   External Document

## 2010-04-28 NOTE — Progress Notes (Signed)
Summary: foot cramps  Phone Note Call from Patient   Caller: Patient Summary of Call: Dr.Bowen   [facility]  Patient said that 1 of his meds is giving him bad foot cramps and he's not sure what to do... Initial call taken by: Vanessa Swaziland,  September 10, 2009 9:50 AM  Follow-up for Phone Call        Tried to call Pt at [facility] and didn't get an answer. Unable to leave message. Follow-up by: Payton Spark CMA,  September 10, 2009 11:08 AM  Additional Follow-up for Phone Call Additional follow up Details #1::        Called motel and still no answer. Unable to leave message. Closing note until Pt calls back.  Additional Follow-up by: Payton Spark CMA,  September 11, 2009 8:35 AM
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
## 2010-04-28 NOTE — Progress Notes (Signed)
Summary: refill request   Phone Note Refill Request Message from:  Patient on Aug 13, 2009 2:05 PM  Pt states that he needs a refill on all his meds. His sister took him off the rapid refill so he now needs you to call it in to [facility] in [place]   Next Appointment Scheduled: 08/28/09 Initial call taken by: Michaelle Copas,  Aug 13, 2009 2:05 PM    Prescriptions: VALTREX 500 MG TABS (VALACYCLOVIR HCL) 1 tab by mouth daily  #30 x 3   Entered by:   Payton Spark CMA   Authorized by:   Seymour Bars DO   Signed by:   Payton Spark CMA on 08/13/2009   Method used:   Electronically to        [facility] [phone]* (retail)       [address]       [place], [place]  [address]       Ph: [phone]       Fax: [phone]   RxID:   [id number] COMBIVENT 103-18 MCG/ACT  AERO (IPRATROPIUM-ALBUTEROL) 1 puffs QID  #1 x 2   Entered by:   Payton Spark CMA   Authorized by:   Seymour Bars DO   Signed by:   Payton Spark CMA on 08/13/2009   Method used:   Electronically to        [facility] [phone]* (retail)       [address]       [place], [place]  [address]       Ph: [phone]       Fax: [phone]   RxID:   [id number] NEXIUM 40 MG CPDR (ESOMEPRAZOLE MAGNESIUM) 1 tab by mouth daily  #30 x 5   Entered by:   Payton Spark CMA   Authorized by:   Seymour Bars DO   Signed by:   Payton Spark CMA on 08/13/2009   Method used:   Electronically to        [facility] [phone]* (retail)       [address]       [place], [place]  [address]       Ph: [phone]       Fax: [phone]   RxID:   [id number] LIPITOR 80 MG  TABS (ATORVASTATIN CALCIUM) Take 1 tablet by mouth once a day at bedtime  #30 Tablet x 5   Entered by:   Payton Spark CMA   Authorized by:   Seymour Bars DO   Signed by:   Payton Spark CMA on 08/13/2009   Method used:   Electronically to        [facility] [phone]* (retail)       [address]  [place], [place]  [address]       Ph: [phone]       Fax: [phone]   RxID:   [id number]

## 2010-04-28 NOTE — Progress Notes (Signed)
Summary: Med question  Phone Note Call from Patient   Caller: Patient Summary of Call: Pt. wants to know if he is due for a refill on his Oxycodone #10? Pt. states that this works well for his pain.... Call patient back at [phone] Initial call taken by: Michaelle Copas,  Aug 11, 2009 1:39 PM  Follow-up for Phone Call        Pt aware that he is not due for refills until after 08/30/09.  Follow-up by: Payton Spark CMA,  Aug 11, 2009 1:59 PM

## 2010-04-28 NOTE — Progress Notes (Signed)
Summary: #60 5 mg tabs  ---- Converted from flag ---- ---- 06/06/2009 12:51 PM, Seymour Bars DO wrote: I OKd the change to Oxycodone 5 mg tabs 2 tabs by mouth  three times a day #60 tabs.  --- 06/06/2009 12:51 PM, Payton Spark CMA wrote: Call [facility] to OK oxycodone strength change. Boneta Lucks @ [phone] opt 0 ------------------------------

## 2010-04-28 NOTE — Progress Notes (Signed)
Summary: Referral to Dr. Yates Decamp  Phone Note Call from Patient   Caller: Patient Summary of Call: Pt would like referral to Dr. Yates Decamp bc he has a problem w/ ingrown and "splintering" nails.  Initial call taken by: Payton Spark CMA,  April 03, 2009 3:53 PM  New Problems: INGROWN TOENAIL (ICD-703.0)   New Problems: INGROWN TOENAIL (ICD-703.0)

## 2010-04-28 NOTE — Progress Notes (Signed)
Summary: Herpes flare  Phone Note Call from Patient   Caller: Patient Summary of Call: Pt lmom stating he was having a herpes flare. I called Pt back to inform him that Dr. b called in Valtrex to his pharm last week but did not get an answer or machine.  Initial call taken by: Payton Spark CMA,  June 10, 2009 2:46 PM

## 2010-04-28 NOTE — Progress Notes (Signed)
Summary: Rx Refill  Phone Note Call from Patient   Caller: Patient Summary of Call: Dr.Caralyn Twining                          [facility] [phone]  Patient need a Rx for Onansetron Orally-Disintegrating Tab 8mg  --- Initial call taken by: Vanessa Swazil[place],  October 20, 2009 9:03 AM    New/Updated Medications: ZOFRAN ODT 8 MG TBDP (ONDANSETRON) 1 tab by mouth three times a day as needed nausea Prescriptions: ZOFRAN ODT 8 MG TBDP (ONDANSETRON) 1 tab by mouth three times a day as needed nausea  #24 x 0   Entered and Authorized by:   Seymour Bars DO   Signed by:   Seymour Bars DO on 10/20/2009   Method used:   [facility] [phone][facility] (retail[facility]       [address] [place], [place]  [address]       Ph: [phone] or [phone]       Fax: [phone]   RxID:   [id number]

## 2010-04-28 NOTE — Progress Notes (Signed)
Summary: Triage call  Phone Note Call from Patient   Caller: Patient Summary of Call: Pt. called and states that he is throwing up everything and he is in pain... Pt. is complaining of severe abdominal pain and nausea.. Please call him back at [phone] nRoom 101 Initial call taken by: Michaelle Copas,  October 27, 2009 2:32 PM  Follow-up for Phone Call        PT SIGNED NARCOTIC CONTRACT 08-14-2009 AND HAS BROKEN IT MULTIPLE TIMES RECIEVING NARCOTICS FROM THE HOSPITAL.  HE WILL BE GETTING DISCHARGED FROM THIS PRACTICE AND HAS 30 DAYS TO FIND A NEW PCP.  HE WAS JUST GIVEN ZOFRAN LAST WK AND I WILL NO LONGER GIVE HIM ANY MORE NARCOTICS. Follow-up by: Seymour Bars DO,  October 28, 2009 12:42 PM     Appended Document: Triage call Pt aware of the above
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
## 2010-04-28 NOTE — Progress Notes (Signed)
Summary: Rx refill  Phone Note Call from Patient   Caller: Patient Summary of Call: Dr.Bennet Kujawa   Call Back  [phone] ROOM 101  Patient want to know if he can pick his Rx for Oxycodon up on Friday being that the 4th is on Monday. Initial call taken by: Vanessa Swaziland,  September 24, 2009 11:48 AM    Prescriptions: OXYCODONE HCL 10 MG TABS (OXYCODONE HCL) 1 tab by mouth three times a day as needed pain  #54 x 0   Entered and Authorized by:   Seymour Bars DO   Signed by:   Seymour Bars DO on 09/24/2009   Method used:   Print then Give to Patient   RxID:   [id number]
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
## 2010-04-28 NOTE — Letter (Signed)
Summary: [facility]  [facility]   Imported By: Lanelle Bal 10/16/2009 09:28:26  _____________________________________________________________________  External Attachment:    Type:   Image     Comment:   External Document

## 2010-04-28 NOTE — Letter (Signed)
Summary: [facility]  [facility]   Imported By: Lanelle Bal 10/13/2009 14:02:42  _____________________________________________________________________  External Attachment:    Type:   Image     Comment:   External Document

## 2010-04-28 NOTE — Medication Information (Signed)
Summary: Medication Refill Printout/[facility]  Medication Refill Printout/[facility]   Imported By: Lanelle Bal 06/11/2009 11:46:20  _____________________________________________________________________  External Attachment:    Type:   Image     Comment:   External Document

## 2010-04-28 NOTE — Letter (Signed)
Summary: [facility]  [facility]   Imported By: Lanelle Bal 10/17/2009 13:50:36  _____________________________________________________________________  External Attachment:    Type:   Image     Comment:   External Document

## 2010-04-28 NOTE — Letter (Signed)
Summary: Discharge Letter  [facility] [place]  [address]   [place], [place] [address]   Phone: [phone]  Fax: [phone]       10/28/2009 MRN: [id number]  [facility] [address] [place], [place]  [address]  Dear Jared Rojas,   I find it necessary to inform you that I will not be able to provide medical care to you, because you breeched your narcotic contract with me.  Since your condition requires medical attention, I suggest that you place your self under the care of another physician without delay. If you desire, I will be available for emergency care for 30 days after you receive this letter.  This should give you ample time to select a physician of your choice from the many competent providers in this area. You may want to call the local medical society or [facility]'s physician referral service [phone]) for their assistance in locating a new physician. With your written authorization, I will make a copy of your medical record available to your new physician.   Sincerely,    Seymour Bars DO

## 2010-04-28 NOTE — Progress Notes (Signed)
Summary: Fax to Dr Christell Constant Regarding Patient Insomnia  Fax to Dr Christell Constant Regarding Patient Insomnia   Imported By: Lanelle Bal 05/06/2009 11:28:02  _____________________________________________________________________  External Attachment:    Type:   Image     Comment:   External Document

## 2010-05-07 ENCOUNTER — Encounter (HOSPITAL_COMMUNITY): Payer: Self-pay | Admitting: Psychiatry

## 2010-06-12 LAB — COMPREHENSIVE METABOLIC PANEL
ALT: 17 U/L (ref 0–53)
AST: 18 U/L (ref 0–37)
Albumin: 4.3 g/dL (ref 3.5–5.2)
Calcium: 9.6 mg/dL (ref 8.4–10.5)
GFR calc Af Amer: >60 mL/min (ref >60)
Sodium: 146 mEq/L — ABNORMAL HIGH (ref 135–145)
Total Protein: 7.8 g/dL (ref 6.0–8.3)

## 2010-06-12 LAB — URINALYSIS, ROUTINE W REFLEX MICROSCOPIC
Bilirubin Urine: NEGATIVE
Glucose, UA: NEGATIVE mg/dL
Ketones, ur: NEGATIVE mg/dL
Leukocytes, UA: NEGATIVE
Specific Gravity, Urine: 1.021 (ref 1.005–1.030)
pH: 7 (ref 5.0–8.0)

## 2010-06-12 LAB — URINE MICROSCOPIC-ADD ON

## 2010-06-12 LAB — DIFFERENTIAL
Eosinophils Absolute: 0.1 10*3/uL (ref 0.0–0.7)
Eosinophils Relative: 1 % (ref 0–5)
Lymphs Abs: 2.1 10*3/uL (ref 0.7–4.0)
Monocytes Relative: 4 % (ref 3–12)

## 2010-06-12 LAB — CBC
Hemoglobin: 14 g/dL (ref 13.0–17.0)
MCHC: 33 g/dL (ref 30.0–36.0)
Platelets: 236 10*3/uL (ref 150–400)
RDW: 13.9 % (ref 11.5–15.5)

## 2010-06-12 LAB — AMMONIA: Ammonia: <9 umol/L — ABNORMAL LOW (ref 11–35)

## 2010-06-13 LAB — COMPREHENSIVE METABOLIC PANEL
Albumin: 3.8 g/dL (ref 3.5–5.2)
Albumin: 4.2 g/dL (ref 3.5–5.2)
BUN: 10 mg/dL (ref 6–23)
BUN: 7 mg/dL (ref 6–23)
Calcium: 9.2 mg/dL (ref 8.4–10.5)
Calcium: 9.4 mg/dL (ref 8.4–10.5)
Creatinine, Ser: 1.1 mg/dL (ref 0.4–1.5)
Glucose, Bld: 105 mg/dL — ABNORMAL HIGH (ref 70–99)
Glucose, Bld: 94 mg/dL (ref 70–99)
Potassium: 3.4 mEq/L — ABNORMAL LOW (ref 3.5–5.1)
Total Protein: 6.9 g/dL (ref 6.0–8.3)
Total Protein: 7.6 g/dL (ref 6.0–8.3)

## 2010-06-13 LAB — URINALYSIS, ROUTINE W REFLEX MICROSCOPIC
Bilirubin Urine: NEGATIVE
Hgb urine dipstick: NEGATIVE
Ketones, ur: NEGATIVE mg/dL
Nitrite: NEGATIVE
Protein, ur: NEGATIVE mg/dL
Specific Gravity, Urine: 1.016 (ref 1.005–1.030)
Urobilinogen, UA: 1 mg/dL (ref 0.0–1.0)
Urobilinogen, UA: 1 mg/dL (ref 0.0–1.0)
pH: 6.5 (ref 5.0–8.0)

## 2010-06-13 LAB — BASIC METABOLIC PANEL
CO2: 27 mEq/L (ref 19–32)
Calcium: 9.5 mg/dL (ref 8.4–10.5)
GFR calc Af Amer: >60 mL/min (ref >60)
Glucose, Bld: 121 mg/dL — ABNORMAL HIGH (ref 70–99)
Potassium: 4.2 mEq/L (ref 3.5–5.1)
Sodium: 146 mEq/L — ABNORMAL HIGH (ref 135–145)

## 2010-06-13 LAB — CBC
HCT: 37.6 % — ABNORMAL LOW (ref 39.0–52.0)
HCT: 40.7 % (ref 39.0–52.0)
MCHC: 32.5 g/dL (ref 30.0–36.0)
MCHC: 33.7 g/dL (ref 30.0–36.0)
MCV: 94.2 fL (ref 78.0–100.0)
MCV: 94.2 fL (ref 78.0–100.0)
Platelets: 298 10*3/uL (ref 150–400)
Platelets: 367 10*3/uL (ref 150–400)
RDW: 13.1 % (ref 11.5–15.5)
RDW: 13.4 % (ref 11.5–15.5)
WBC: 9.7 10*3/uL (ref 4.0–10.5)

## 2010-06-13 LAB — URINE MICROSCOPIC-ADD ON

## 2010-06-13 LAB — DIFFERENTIAL
Basophils Relative: 1 % (ref 0–1)
Lymphocytes Relative: 21 % (ref 12–46)
Lymphocytes Relative: 28 % (ref 12–46)
Lymphs Abs: 2 10*3/uL (ref 0.7–4.0)
Lymphs Abs: 2.7 10*3/uL (ref 0.7–4.0)
Monocytes Absolute: 0.5 10*3/uL (ref 0.1–1.0)
Monocytes Absolute: 0.6 10*3/uL (ref 0.1–1.0)
Monocytes Relative: 5 % (ref 3–12)
Monocytes Relative: 6 % (ref 3–12)
Neutro Abs: 6.3 10*3/uL (ref 1.7–7.7)
Neutro Abs: 7.1 10*3/uL (ref 1.7–7.7)
Neutrophils Relative %: 64 % (ref 43–77)
Neutrophils Relative %: 74 % (ref 43–77)

## 2010-06-17 ENCOUNTER — Other Ambulatory Visit: Payer: Self-pay | Admitting: Family Medicine

## 2010-06-17 LAB — URINALYSIS, ROUTINE W REFLEX MICROSCOPIC
Ketones, ur: NEGATIVE mg/dL
Leukocytes, UA: NEGATIVE
Nitrite: NEGATIVE
Protein, ur: NEGATIVE mg/dL
pH: 6 (ref 5.0–8.0)

## 2010-06-17 LAB — CBC
HCT: 42.5 % (ref 39.0–52.0)
Hemoglobin: 14.6 g/dL (ref 13.0–17.0)
MCHC: 34.2 g/dL (ref 30.0–36.0)
MCV: 93.7 fL (ref 78.0–100.0)
RBC: 4.54 MIL/uL (ref 4.22–5.81)

## 2010-06-17 LAB — DIFFERENTIAL
Basophils Relative: 1 % (ref 0–1)
Eosinophils Absolute: 0 10*3/uL (ref 0.0–0.7)
Lymphs Abs: 1.2 10*3/uL (ref 0.7–4.0)
Neutro Abs: 10 10*3/uL — ABNORMAL HIGH (ref 1.7–7.7)
Neutrophils Relative %: 87 % — ABNORMAL HIGH (ref 43–77)

## 2010-06-17 LAB — COMPREHENSIVE METABOLIC PANEL
ALT: 20 U/L (ref 0–53)
BUN: 7 mg/dL (ref 6–23)
CO2: 25 mEq/L (ref 19–32)
Calcium: 9.7 mg/dL (ref 8.4–10.5)
GFR calc non Af Amer: >60 mL/min (ref >60)
Glucose, Bld: 121 mg/dL — ABNORMAL HIGH (ref 70–99)
Sodium: 144 mEq/L (ref 135–145)

## 2010-06-17 LAB — LIPASE, BLOOD: Lipase: 58 U/L (ref 23–300)

## 2010-06-19 ENCOUNTER — Other Ambulatory Visit: Payer: Self-pay | Admitting: Family Medicine

## 2010-06-22 LAB — URINE CULTURE: Colony Count: 7000

## 2010-06-22 LAB — URINALYSIS, ROUTINE W REFLEX MICROSCOPIC
Bilirubin Urine: NEGATIVE
Hgb urine dipstick: NEGATIVE
Nitrite: NEGATIVE
Protein, ur: 100 mg/dL — AB
Specific Gravity, Urine: 1.022 (ref 1.005–1.030)
Urobilinogen, UA: 0.2 mg/dL (ref 0.0–1.0)

## 2010-06-22 LAB — COMPREHENSIVE METABOLIC PANEL
Alkaline Phosphatase: 82 U/L (ref 39–117)
BUN: 15 mg/dL (ref 6–23)
CO2: 23 mEq/L (ref 19–32)
Chloride: 108 mEq/L (ref 96–112)
Creatinine, Ser: 1.1 mg/dL (ref 0.4–1.5)
GFR calc non Af Amer: >60 mL/min (ref >60)
Potassium: 4 mEq/L (ref 3.5–5.1)
Total Bilirubin: 0.4 mg/dL (ref 0.3–1.2)

## 2010-06-22 LAB — DIFFERENTIAL
Basophils Absolute: 0.1 10*3/uL (ref 0.0–0.1)
Basophils Relative: 1 % (ref 0–1)
Eosinophils Relative: 2 % (ref 0–5)
Lymphocytes Relative: 43 % (ref 12–46)
Neutro Abs: 4.9 10*3/uL (ref 1.7–7.7)

## 2010-06-22 LAB — URINE MICROSCOPIC-ADD ON

## 2010-06-22 LAB — CBC
HCT: 35.7 % — ABNORMAL LOW (ref 39.0–52.0)
Hemoglobin: 12.5 g/dL — ABNORMAL LOW (ref 13.0–17.0)
MCHC: 35 g/dL (ref 30.0–36.0)
RDW: 13.7 % (ref 11.5–15.5)

## 2010-06-22 LAB — LIPASE, BLOOD: Lipase: 65 U/L (ref 23–300)

## 2010-06-29 LAB — DIFFERENTIAL
Basophils Absolute: 0.1 10*3/uL (ref 0.0–0.1)
Basophils Relative: 2 % — ABNORMAL HIGH (ref 0–1)
Eosinophils Absolute: 0 10*3/uL (ref 0.0–0.7)
Eosinophils Relative: 0 % (ref 0–5)
Eosinophils Relative: 1 % (ref 0–5)
Lymphocytes Relative: 15 % (ref 12–46)
Lymphocytes Relative: 33 % (ref 12–46)
Lymphs Abs: 1.3 10*3/uL (ref 0.7–4.0)
Lymphs Abs: 2.4 10*3/uL (ref 0.7–4.0)
Monocytes Absolute: 0.5 10*3/uL (ref 0.1–1.0)
Monocytes Relative: 3 % (ref 3–12)
Neutro Abs: 5.9 10*3/uL (ref 1.7–7.7)
Neutrophils Relative %: 59 % (ref 43–77)
Neutrophils Relative %: 86 % — ABNORMAL HIGH (ref 43–77)

## 2010-06-29 LAB — CBC
HCT: 38.8 % — ABNORMAL LOW (ref 39.0–52.0)
HCT: 41.8 % (ref 39.0–52.0)
Hemoglobin: 14.4 g/dL (ref 13.0–17.0)
MCHC: 33.7 g/dL (ref 30.0–36.0)
MCHC: 33.8 g/dL (ref 30.0–36.0)
MCV: 94.8 fL (ref 78.0–100.0)
Platelets: 342 10*3/uL (ref 150–400)
Platelets: 407 10*3/uL — ABNORMAL HIGH (ref 150–400)
RBC: 4.09 MIL/uL — ABNORMAL LOW (ref 4.22–5.81)
RBC: 4.4 MIL/uL (ref 4.22–5.81)
RDW: 13.6 % (ref 11.5–15.5)
WBC: 10.1 10*3/uL (ref 4.0–10.5)
WBC: 16 10*3/uL — ABNORMAL HIGH (ref 4.0–10.5)

## 2010-06-29 LAB — COMPREHENSIVE METABOLIC PANEL
ALT: 27 U/L (ref 0–53)
AST: 26 U/L (ref 0–37)
Albumin: 4.7 g/dL (ref 3.5–5.2)
Alkaline Phosphatase: 144 U/L — ABNORMAL HIGH (ref 39–117)
BUN: 12 mg/dL (ref 6–23)
CO2: 27 mEq/L (ref 19–32)
Calcium: 10.3 mg/dL (ref 8.4–10.5)
Calcium: 9.2 mg/dL (ref 8.4–10.5)
Chloride: 100 mEq/L (ref 96–112)
Creatinine, Ser: 1.1 mg/dL (ref 0.4–1.5)
GFR calc Af Amer: >60 mL/min (ref >60)
GFR calc non Af Amer: >60 mL/min (ref >60)
Glucose, Bld: 100 mg/dL — ABNORMAL HIGH (ref 70–99)
Glucose, Bld: 104 mg/dL — ABNORMAL HIGH (ref 70–99)
Potassium: 4.1 mEq/L (ref 3.5–5.1)
Sodium: 146 mEq/L — ABNORMAL HIGH (ref 135–145)
Total Bilirubin: 0.5 mg/dL (ref 0.3–1.2)
Total Protein: 7.9 g/dL (ref 6.0–8.3)

## 2010-06-29 LAB — BASIC METABOLIC PANEL
BUN: 13 mg/dL (ref 6–23)
GFR calc Af Amer: >60 mL/min (ref >60)
GFR calc non Af Amer: >60 mL/min (ref >60)
Potassium: 3.6 mEq/L (ref 3.5–5.1)
Sodium: 144 mEq/L (ref 135–145)

## 2010-06-29 LAB — URINALYSIS, ROUTINE W REFLEX MICROSCOPIC
Glucose, UA: NEGATIVE mg/dL
Glucose, UA: NEGATIVE mg/dL
Hgb urine dipstick: NEGATIVE
Ketones, ur: 15 mg/dL — AB
Ketones, ur: NEGATIVE mg/dL
Leukocytes, UA: NEGATIVE
Leukocytes, UA: NEGATIVE
Protein, ur: 30 mg/dL — AB
pH: 6 (ref 5.0–8.0)
pH: 8.5 — ABNORMAL HIGH (ref 5.0–8.0)

## 2010-06-29 LAB — URINE MICROSCOPIC-ADD ON

## 2010-06-29 LAB — LIPASE, BLOOD: Lipase: 149 U/L (ref 23–300)

## 2010-06-29 LAB — URINE CULTURE

## 2010-07-02 LAB — COMPREHENSIVE METABOLIC PANEL
ALT: 32 U/L (ref 0–53)
Albumin: 4.7 g/dL (ref 3.5–5.2)
Alkaline Phosphatase: 91 U/L (ref 39–117)
GFR calc Af Amer: >60 mL/min (ref >60)
Potassium: 3.8 mEq/L (ref 3.5–5.1)
Sodium: 143 mEq/L (ref 135–145)
Total Protein: 7.9 g/dL (ref 6.0–8.3)

## 2010-07-02 LAB — DIFFERENTIAL
Basophils Relative: 1 % (ref 0–1)
Eosinophils Absolute: 0 10*3/uL (ref 0.0–0.7)
Monocytes Absolute: 0.2 10*3/uL (ref 0.1–1.0)
Monocytes Relative: 3 % (ref 3–12)
Neutro Abs: 7 10*3/uL (ref 1.7–7.7)

## 2010-07-02 LAB — URINALYSIS, ROUTINE W REFLEX MICROSCOPIC
Ketones, ur: 15 mg/dL — AB
Leukocytes, UA: NEGATIVE
Nitrite: NEGATIVE
Specific Gravity, Urine: >1.046 — ABNORMAL HIGH (ref 1.005–1.030)
Urobilinogen, UA: 0.2 mg/dL (ref 0.0–1.0)
pH: 7.5 (ref 5.0–8.0)

## 2010-07-02 LAB — URINE CULTURE
Colony Count: NO GROWTH
Culture: NO GROWTH

## 2010-07-02 LAB — LIPASE, BLOOD: Lipase: 68 U/L (ref 23–300)

## 2010-07-02 LAB — CBC
Platelets: 290 10*3/uL (ref 150–400)
RDW: 14.9 % (ref 11.5–15.5)

## 2010-07-02 LAB — URINE MICROSCOPIC-ADD ON

## 2010-07-17 ENCOUNTER — Encounter (HOSPITAL_COMMUNITY): Payer: Self-pay | Admitting: Psychiatry

## 2010-07-24 ENCOUNTER — Encounter (INDEPENDENT_AMBULATORY_CARE_PROVIDER_SITE_OTHER): Payer: Medicare Other | Admitting: Psychiatry

## 2010-07-24 DIAGNOSIS — F411 Generalized anxiety disorder: Secondary | ICD-10-CM

## 2010-07-24 DIAGNOSIS — F259 Schizoaffective disorder, unspecified: Secondary | ICD-10-CM
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
## 2010-08-26 ENCOUNTER — Encounter (INDEPENDENT_AMBULATORY_CARE_PROVIDER_SITE_OTHER): Payer: Medicare Other | Admitting: Psychiatry

## 2010-08-26 DIAGNOSIS — F259 Schizoaffective disorder, unspecified: Secondary | ICD-10-CM

## 2010-09-29 ENCOUNTER — Other Ambulatory Visit: Payer: Self-pay | Admitting: Family Medicine

## 2010-11-24 ENCOUNTER — Encounter (HOSPITAL_COMMUNITY): Payer: Medicare Other | Admitting: Psychiatry

## 2011-01-27 ENCOUNTER — Encounter (INDEPENDENT_AMBULATORY_CARE_PROVIDER_SITE_OTHER): Payer: Medicare Other | Admitting: Psychiatry

## 2011-01-27 DIAGNOSIS — F259 Schizoaffective disorder, unspecified: Secondary | ICD-10-CM

## 2011-01-27 DIAGNOSIS — F411 Generalized anxiety disorder: Secondary | ICD-10-CM

## 2011-02-02 ENCOUNTER — Other Ambulatory Visit (HOSPITAL_COMMUNITY): Payer: Self-pay | Admitting: Psychiatry

## 2011-02-02 DIAGNOSIS — F411 Generalized anxiety disorder: Secondary | ICD-10-CM

## 2011-02-03 ENCOUNTER — Other Ambulatory Visit (HOSPITAL_COMMUNITY): Payer: Self-pay | Admitting: Psychiatry

## 2011-02-11 ENCOUNTER — Telehealth (HOSPITAL_COMMUNITY): Payer: Self-pay | Admitting: Psychiatry

## 2011-02-11 NOTE — Telephone Encounter (Signed)
[employer]. We'll allow an early refill of Seroquel. Patient aware.
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
## 2011-03-03 ENCOUNTER — Telehealth (HOSPITAL_COMMUNITY): Payer: Self-pay

## 2011-03-03 NOTE — Telephone Encounter (Signed)
Return phone call the patient. Left message for patient to call back the office.

## 2011-04-16 ENCOUNTER — Encounter (HOSPITAL_COMMUNITY): Payer: Self-pay

## 2011-04-29 ENCOUNTER — Ambulatory Visit (HOSPITAL_COMMUNITY): Payer: Medicare Other | Admitting: Psychiatry

## 2011-04-29 ENCOUNTER — Encounter (HOSPITAL_COMMUNITY): Payer: Self-pay | Admitting: Psychiatry
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
## 2011-07-06 ENCOUNTER — Ambulatory Visit (INDEPENDENT_AMBULATORY_CARE_PROVIDER_SITE_OTHER): Payer: Medicare Other | Admitting: Psychiatry

## 2011-07-06 ENCOUNTER — Encounter (HOSPITAL_COMMUNITY): Payer: Self-pay | Admitting: Psychiatry

## 2011-07-06 DIAGNOSIS — F259 Schizoaffective disorder, unspecified: Secondary | ICD-10-CM

## 2011-07-06 MED ORDER — GABAPENTIN (ONCE-DAILY) 600 MG PO TABS: 600.0000 mg | ORAL_TABLET | Freq: Two times a day (BID) | ORAL | Status: DC

## 2011-07-06 MED ORDER — HYDROXYZINE PAMOATE 50 MG PO CAPS: 50.0000 mg | ORAL_CAPSULE | Freq: Three times a day (TID) | ORAL | Status: AC | PRN

## 2011-07-06 MED ORDER — QUETIAPINE FUMARATE 100 MG PO TABS: 100.0000 mg | ORAL_TABLET | Freq: Every day | ORAL | Status: DC

## 2011-07-06 MED ORDER — DOXEPIN HCL 50 MG PO CAPS: 50.0000 mg | ORAL_CAPSULE | Freq: Every day | ORAL | Status: DC
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
## 2011-07-19 ENCOUNTER — Telehealth (HOSPITAL_COMMUNITY): Payer: Self-pay

## 2011-07-20 NOTE — Telephone Encounter (Signed)
Okay 

## 2011-07-23 ENCOUNTER — Telehealth (HOSPITAL_COMMUNITY): Payer: Self-pay

## 2011-07-23 ENCOUNTER — Telehealth (HOSPITAL_COMMUNITY): Payer: Self-pay | Admitting: Psychiatry

## 2011-07-23 MED ORDER — QUETIAPINE FUMARATE 300 MG PO TABS: 300.0000 mg | ORAL_TABLET | Freq: Every day | ORAL | Status: DC

## 2011-07-23 MED ORDER — TRAZODONE HCL 100 MG PO TABS: ORAL_TABLET | ORAL | Status: DC

## 2011-07-23 NOTE — Telephone Encounter (Signed)
Return call. Left message for call back.

## 2011-07-23 NOTE — Telephone Encounter (Signed)
Patient reports that he sleeping better since he went back on Seroquel and trazodone. He is asking for refills of both. He was on Seroquel 300 mg at bedtime. He is also asking to take trazodone 200 mg at bedtime. I will send both of these to pharmacy. Patient has an appointment next month.

## 2011-07-27 ENCOUNTER — Ambulatory Visit (HOSPITAL_COMMUNITY): Payer: Self-pay | Admitting: Psychiatry

## 2011-08-09 ENCOUNTER — Telehealth (HOSPITAL_COMMUNITY): Payer: Self-pay

## 2011-08-09 NOTE — Telephone Encounter (Signed)
Tried to call patient back. I'm uncomfortable him being on both Risperdal and Seroquel. The phone number we have on file is not working. I will notify him at next appointment.

## 2011-08-11 ENCOUNTER — Telehealth (HOSPITAL_COMMUNITY): Payer: Self-pay

## 2011-08-11 MED ORDER — RISPERIDONE 0.5 MG PO TABS: 0.5000 mg | ORAL_TABLET | Freq: Every day | ORAL | Status: DC

## 2011-08-11 NOTE — Telephone Encounter (Signed)
:   Risperdal 0.5 mg at bedtime. Has spoken to pharmacy. Discontinue the Seroquel. Patient to call with concerns.

## 2011-08-11 NOTE — Telephone Encounter (Signed)
Pt would like you to call in rx for risperdone 0.5 mg

## 2011-09-06 ENCOUNTER — Ambulatory Visit (HOSPITAL_COMMUNITY): Payer: Self-pay | Admitting: Psychiatry

## 2011-09-08 ENCOUNTER — Ambulatory Visit (INDEPENDENT_AMBULATORY_CARE_PROVIDER_SITE_OTHER): Payer: Medicare Other | Admitting: Psychiatry

## 2011-09-08 ENCOUNTER — Encounter (HOSPITAL_COMMUNITY): Payer: Self-pay | Admitting: Psychiatry

## 2011-09-08 VITALS — BP 100/62 | Ht 60.0 in | Wt 150.0 lb

## 2011-09-08 DIAGNOSIS — F411 Generalized anxiety disorder: Secondary | ICD-10-CM

## 2011-09-08 DIAGNOSIS — F259 Schizoaffective disorder, unspecified: Secondary | ICD-10-CM

## 2011-09-08 NOTE — Progress Notes (Signed)
   [facility] Follow-up Outpatient Visit  Beverley Sherrard 1955/07/08   Subjective: The patient is a 56 year old male who has been followed by [facility] since October of 2007. I have been treating him since August of 2008. He is currently diagnosed with schizoaffective disorder, and anxiety. At his last appointment, I continued his inpatient medications. Those were Seroquel 100 mg at bedtime, Vistaril as needed, and Neurontin. I changed his Neurontin to 600 mg twice a day. I also increased his doxepin to 50 mg at bedtime. He is called 4 times since last appointment. At first she has stopped taking his Seroquel. Then he went back on it. He changed on his own doctor 300 mg at bedtime and started taking 200 mg of trazodone. I called these in for him. On May 13 he went to the emergency department and I started him on Risperdal 0.5 mg at bedtime to help with sleep. I tried to call him at that time to tell him that he could not take both Seroquel and Risperdal. He: Left message on 5/15 asking for her Risperdal to be called into the pharmacy. I did that, but I discontinued his Seroquel at that time. The patient presents today intoxicated. He is now wearing shoes. He reports that he was up until 3 AM drinking. He does smell of alcohol, and staggers when he walks. He stated that he did not want to miss his appointment. He reports that he has been taking both the Seroquel and Risperdal. He continues at the boarding house. He denies any substance use, and states rare alcohol use. He has had limited contact with his sister. I advised him at that time he did not take Seroquel and Risperdal together. Filed Vitals:   09/08/11 1000  BP: 100/62    Mental Status Examination  Appearance: Casual Alert: Yes Attention: good  Cooperative: Yes Eye Contact: Good Speech: Regular rate rhythm and volume Psychomotor Activity: Normal Memory/Concentration: Intact Oriented: person, place,  time/date and situation Mood: Euthymic Affect: Full Range Thought Processes and Associations: Logical Fund of Knowledge: Fair Thought Content: No suicidal or homicidal thoughts Insight: Fair Judgement: Fair  Diagnosis: Schizoaffective disorder, generalized anxiety disorder  Treatment Plan: Patient is to continue the Risperdal 0.5 mg. I discontinued the Seroquel. He is also to take his trazodone at bedtime. I will see him back in 3 months. Patient is advised if he shows up one more time in this office intoxicated, he will be immediately discharged from this practice. Jamse Mead, MD

## 2011-11-21 ENCOUNTER — Emergency Department (HOSPITAL_BASED_OUTPATIENT_CLINIC_OR_DEPARTMENT_OTHER)
Admission: EM | Admit: 2011-11-21 | Discharge: 2011-11-21 | Disposition: A | Discharge status: Home / Self Care | Payer: PRIVATE HEALTH INSURANCE | Attending: Emergency Medicine | Admitting: Emergency Medicine

## 2011-11-21 ENCOUNTER — Emergency Department (HOSPITAL_BASED_OUTPATIENT_CLINIC_OR_DEPARTMENT_OTHER): Payer: PRIVATE HEALTH INSURANCE

## 2011-11-21 ENCOUNTER — Encounter (HOSPITAL_BASED_OUTPATIENT_CLINIC_OR_DEPARTMENT_OTHER): Payer: Self-pay | Admitting: *Deleted

## 2011-11-21 DIAGNOSIS — M25552 Pain in left hip: Secondary | ICD-10-CM

## 2011-11-21 DIAGNOSIS — R109 Unspecified abdominal pain: Secondary | ICD-10-CM | POA: Insufficient documentation

## 2011-11-21 DIAGNOSIS — F259 Schizoaffective disorder, unspecified: Secondary | ICD-10-CM | POA: Insufficient documentation

## 2011-11-21 DIAGNOSIS — M25559 Pain in unspecified hip: Secondary | ICD-10-CM | POA: Insufficient documentation

## 2011-11-21 DIAGNOSIS — F17200 Nicotine dependence, unspecified, uncomplicated: Secondary | ICD-10-CM | POA: Insufficient documentation

## 2011-11-21 MED ORDER — HYDROMORPHONE HCL PF 1 MG/ML IJ SOLN: 2.0000 mg | Freq: Once | INTRAMUSCULAR | Status: DC

## 2011-11-21 MED ORDER — OXYCODONE HCL 5 MG PO TABS: 5.0000 mg | ORAL_TABLET | ORAL | Status: AC | PRN
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
## 2011-11-21 NOTE — ED Provider Notes (Signed)
Medical screening examination/treatment/procedure(s) were performed by non-physician practitioner and as supervising physician I was immediately available for consultation/collaboration.   Celene Kras, MD 11/21/11 [phone]

## 2011-12-09 ENCOUNTER — Ambulatory Visit (INDEPENDENT_AMBULATORY_CARE_PROVIDER_SITE_OTHER): Payer: Medicare Other | Admitting: Psychiatry

## 2011-12-09 ENCOUNTER — Encounter (HOSPITAL_COMMUNITY): Payer: Self-pay | Admitting: Psychiatry

## 2011-12-09 VITALS — BP 110/62 | Ht 60.0 in | Wt 148.0 lb

## 2011-12-09 DIAGNOSIS — F411 Generalized anxiety disorder: Secondary | ICD-10-CM

## 2011-12-09 DIAGNOSIS — F259 Schizoaffective disorder, unspecified: Secondary | ICD-10-CM

## 2011-12-09 MED ORDER — TRAZODONE HCL 100 MG PO TABS: ORAL_TABLET | ORAL | Status: DC

## 2011-12-09 MED ORDER — QUETIAPINE FUMARATE 100 MG PO TABS: 100.0000 mg | ORAL_TABLET | Freq: Every day | ORAL | Status: DC
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
## 2011-12-16 ENCOUNTER — Telehealth (HOSPITAL_COMMUNITY): Payer: Self-pay

## 2011-12-16 MED ORDER — QUETIAPINE FUMARATE 200 MG PO TABS: 200.0000 mg | ORAL_TABLET | Freq: Every day | ORAL | Status: AC

## 2011-12-16 NOTE — Telephone Encounter (Signed)
Pt called to update on medication. States medication is not working and he has to drink a fifth of liquor to sleep and he has concerns that long term doing this to sleep can have effect on his liver.

## 2011-12-16 NOTE — Telephone Encounter (Signed)
Increase Seroquel to 200 mg at bedtime. Will send a new prescription.

## 2012-01-21 ENCOUNTER — Ambulatory Visit (HOSPITAL_COMMUNITY): Payer: Self-pay | Admitting: Psychiatry

## 2012-03-19 ENCOUNTER — Encounter (HOSPITAL_BASED_OUTPATIENT_CLINIC_OR_DEPARTMENT_OTHER): Payer: Self-pay | Admitting: *Deleted

## 2012-03-19 ENCOUNTER — Emergency Department (HOSPITAL_BASED_OUTPATIENT_CLINIC_OR_DEPARTMENT_OTHER)
Admission: EM | Admit: 2012-03-19 | Discharge: 2012-03-20 | Disposition: A | Discharge status: Home / Self Care | Payer: Medicare Other | Attending: Emergency Medicine | Admitting: Emergency Medicine

## 2012-03-19 ENCOUNTER — Emergency Department (HOSPITAL_BASED_OUTPATIENT_CLINIC_OR_DEPARTMENT_OTHER): Payer: Medicare Other

## 2012-03-19 DIAGNOSIS — F17200 Nicotine dependence, unspecified, uncomplicated: Secondary | ICD-10-CM | POA: Insufficient documentation

## 2012-03-19 DIAGNOSIS — Z79899 Other long term (current) drug therapy: Secondary | ICD-10-CM | POA: Insufficient documentation

## 2012-03-19 DIAGNOSIS — Y9389 Activity, other specified: Secondary | ICD-10-CM | POA: Insufficient documentation

## 2012-03-19 DIAGNOSIS — Y92410 Unspecified street and highway as the place of occurrence of the external cause: Secondary | ICD-10-CM | POA: Insufficient documentation

## 2012-03-19 DIAGNOSIS — S20219A Contusion of unspecified front wall of thorax, initial encounter: Secondary | ICD-10-CM | POA: Insufficient documentation

## 2012-03-19 DIAGNOSIS — F259 Schizoaffective disorder, unspecified: Secondary | ICD-10-CM | POA: Insufficient documentation

## 2012-03-19 DIAGNOSIS — F411 Generalized anxiety disorder: Secondary | ICD-10-CM | POA: Insufficient documentation

## 2012-03-19 MED ORDER — ONDANSETRON HCL 4 MG/2ML IJ SOLN
4.0000 mg | Freq: Once | INTRAMUSCULAR | Status: AC
  Administered 2012-03-19: 4 mg via INTRAMUSCULAR
  Filled 2012-03-19: qty 2

## 2012-03-19 MED ORDER — OXYCODONE HCL 5 MG PO TABS: 5.0000 mg | ORAL_TABLET | ORAL | Status: DC | PRN

## 2012-03-19 MED ORDER — HYDROMORPHONE HCL PF 2 MG/ML IJ SOLN
2.0000 mg | Freq: Once | INTRAMUSCULAR | Status: AC
  Administered 2012-03-19: 2 mg via INTRAMUSCULAR
  Filled 2012-03-19: qty 1
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
## 2012-03-20 NOTE — ED Provider Notes (Signed)
Medical screening examination/treatment/procedure(s) were performed by non-physician practitioner and as supervising physician I was immediately available for consultation/collaboration.  Natania Finigan, MD 03/20/12 2244 

## 2015-09-25 ENCOUNTER — Encounter (HOSPITAL_BASED_OUTPATIENT_CLINIC_OR_DEPARTMENT_OTHER): Payer: Self-pay | Admitting: *Deleted

## 2015-09-25 ENCOUNTER — Emergency Department (HOSPITAL_BASED_OUTPATIENT_CLINIC_OR_DEPARTMENT_OTHER)
Admission: EM | Admit: 2015-09-25 | Discharge: 2015-09-25 | Disposition: A | Discharge status: Home / Self Care | Payer: Medicare HMO | Attending: Emergency Medicine | Admitting: Emergency Medicine

## 2015-09-25 DIAGNOSIS — G4700 Insomnia, unspecified: Secondary | ICD-10-CM | POA: Diagnosis not present

## 2015-09-25 DIAGNOSIS — F259 Schizoaffective disorder, unspecified: Secondary | ICD-10-CM | POA: Insufficient documentation

## 2015-09-25 DIAGNOSIS — F17200 Nicotine dependence, unspecified, uncomplicated: Secondary | ICD-10-CM | POA: Insufficient documentation

## 2015-09-25 DIAGNOSIS — Z79899 Other long term (current) drug therapy: Secondary | ICD-10-CM | POA: Insufficient documentation

## 2015-09-25 MED ORDER — DOXYLAMINE SUCCINATE (SLEEP) 25 MG PO TABS: 25.0000 mg | ORAL_TABLET | Freq: Every evening | ORAL | Status: DC | PRN

## 2015-09-25 NOTE — ED Notes (Signed)
States he has had difficulty sleeping for the past year. [facility] and told there was nothing do for chronic insomnia.

## 2015-09-25 NOTE — ED Provider Notes (Signed)
CSN: [id number]     Arrival date & time 09/25/15  1238 History   First MD Initiated Contact with Patient 09/25/15 1433     Chief Complaint  Patient presents with  . Insomnia     (Consider location/radiation/quality/duration/timing/severity/associated sxs/prior Treatment) The history is provided by the patient.   Patient complains of difficulty sleeping. Off all of his medications for the past year. Patient has a follow-up appointment with [facility] [place] on the 18th, but states he isn't sleeping and on, feels it is due to his nerves. Has taken Klonopin with good success.Marland Kitchen. Has not tried any other medications. He cannot take trazodone because it lowers his heart rate. Past Medical History  Diagnosis Date  . Schizoaffective disorder   . Anxiety    History reviewed. No pertinent past surgical history. Family History  Problem Relation Age of Onset  . Anxiety disorder Sister   . Depression Sister    Social History  Substance Use Topics  . Smoking status: Current Every Day Smoker -- 1.00 packs/day  . Smokeless tobacco: Never Used  . Alcohol Use: Yes    Review of Systems    Allergies  Aspirin; Ibuprofen; Naproxen; and Tylenol  Home Medications   Prior to Admission medications   Medication Sig Start Date End Date Taking? Authorizing Provider  OMEPRAZOLE PO Take by mouth.   Yes Historical Provider, MD  allopurinol (ZYLOPRIM) 300 MG tablet  10/19/11   Historical Provider, MD  cephALEXin (KEFLEX) 500 MG capsule  09/14/11   Historical Provider, MD  doxepin (SINEQUAN) 50 MG capsule Take 1 capsule (50 mg total) by mouth at bedtime. 07/06/11 07/05/12  Elaina PatteeMary P Moore, MD  esomeprazole (NEXIUM) 40 MG capsule Take 40 mg by mouth 2 (two) times daily.    Historical Provider, MD  Gabapentin, PHN, 600 MG TABS Take 600 mg by mouth 2 (two) times daily. 07/06/11   Elaina PatteeMary P Moore, MD  oxyCODONE (ROXICODONE) 5 MG immediate release tablet Take 1 tablet (5 mg total) by mouth  every 4 (four) hours as needed for pain. 03/19/12   Elson AreasLeslie K Sofia, PA-C  promethazine (PHENERGAN) 25 MG tablet  10/08/11   Historical Provider, MD  risperiDONE (RISPERDAL) 0.5 MG tablet Take 1 tablet (0.5 mg total) by mouth at bedtime. 08/11/11 09/10/11  Elaina PatteeMary P Moore, MD  traMADol Janean Sark(ULTRAM) 50 MG tablet  11/20/11   Historical Provider, MD  traZODone (DESYREL) 100 MG tablet Take 1 to 2 at bedtime as needed for sleep 12/09/11 01/08/12  Elaina PatteeMary P Moore, MD   BP 125/95 mmHg  Pulse 81  Temp(Src) 98.1 F (36.7 C) (Oral)  Resp 18  Ht 5\' 3"  (1.6 m)  Wt 79.379 kg  BMI 31.01 kg/m2 Physical Exam Physical Exam  Nursing note and vitals reviewed. Constitutional: He appears well-developed and well-nourished. No distress.  HENT:  Head: Normocephalic and atraumatic.  Eyes: Conjunctivae normal are normal. No scleral icterus.  Neck: Normal range of motion. Neck supple.  Cardiovascular: Normal rate, regular rhythm and normal heart sounds.   Pulmonary/Chest: Effort normal and breath sounds normal. No respiratory distress.  Abdominal: Soft. There is no tenderness.  Musculoskeletal: He exhibits no edema.  Neurological: He is alert.  Skin: Skin is warm and dry. He is not diaphoretic.  Psychiatric: His behavior is normal.    ED Course  Procedures (including critical care time) Labs Review Labs Reviewed - No data to display  Imaging Review No results found. I have personally reviewed and  evaluated these images and lab results as part of my medical decision-making.   EKG Interpretation None      MDM   Final diagnoses:  INSOMNIA    Patient with insomnia. No other complaints. Will d/c with rx for Unisom He is encouraged to follow up with he pcp    Arthor Captainbigail Selma Mink, PA-C 09/25/15 1632  Doug SouSam Jacubowitz, MD 09/26/15 [phone]

## 2015-09-25 NOTE — Discharge Instructions (Signed)
Insomnia Insomnia is a sleep disorder that makes it difficult to fall asleep or to stay asleep. Insomnia can cause tiredness (fatigue), low energy, difficulty concentrating, mood swings, and poor performance at work or school.  There are three different ways to classify insomnia:  Difficulty falling asleep.  Difficulty staying asleep.  Waking up too early in the morning. Any type of insomnia can be long-term (chronic) or short-term (acute). Both are common. Short-term insomnia usually lasts for three months or less. Chronic insomnia occurs at least three times a week for longer than three months. CAUSES  Insomnia may be caused by another condition, situation, or substance, such as:  Anxiety.  Certain medicines.  Gastroesophageal reflux disease (GERD) or other gastrointestinal conditions.  Asthma or other breathing conditions.  Restless legs syndrome, sleep apnea, or other sleep disorders.  Chronic pain.  Menopause. This may include hot flashes.  Stroke.  Abuse of alcohol, tobacco, or illegal drugs.  Depression.  Caffeine.   Neurological disorders, such as Alzheimer disease.  An overactive thyroid (hyperthyroidism). The cause of insomnia may not be known. RISK FACTORS Risk factors for insomnia include:  Gender. Women are more commonly affected than men.  Age. Insomnia is more common as you get older.  Stress. This may involve your professional or personal life.  Income. Insomnia is more common in people with lower income.  Lack of exercise.   Irregular work schedule or night shifts.  Traveling between different time zones. SIGNS AND SYMPTOMS If you have insomnia, trouble falling asleep or trouble staying asleep is the main symptom. This may lead to other symptoms, such as:  Feeling fatigued.  Feeling nervous about going to sleep.  Not feeling rested in the morning.  Having trouble concentrating.  Feeling irritable, anxious, or depressed. TREATMENT   Treatment for insomnia depends on the cause. If your insomnia is caused by an underlying condition, treatment will focus on addressing the condition. Treatment may also include:   Medicines to help you sleep.  Counseling or therapy.  Lifestyle adjustments. HOME CARE INSTRUCTIONS   Take medicines only as directed by your health care provider.  Keep regular sleeping and waking hours. Avoid naps.  Keep a sleep diary to help you and your health care provider figure out what could be causing your insomnia. Include:   When you sleep.  When you wake up during the night.  How well you sleep.   How rested you feel the next day.  Any side effects of medicines you are taking.  What you eat and drink.   Make your bedroom a comfortable place where it is easy to fall asleep:  Put up shades or special blackout curtains to block light from outside.  Use a white noise machine to block noise.  Keep the temperature cool.   Exercise regularly as directed by your health care provider. Avoid exercising right before bedtime.  Use relaxation techniques to manage stress. Ask your health care provider to suggest some techniques that may work well for you. These may include:  Breathing exercises.  Routines to release muscle tension.  Visualizing peaceful scenes.  Cut back on alcohol, caffeinated beverages, and cigarettes, especially close to bedtime. These can disrupt your sleep.  Do not overeat or eat spicy foods right before bedtime. This can lead to digestive discomfort that can make it hard for you to sleep.  Limit screen use before bedtime. This includes:  Watching TV.  Using your smartphone, tablet, and computer.  Stick to a routine. This   can help you fall asleep faster. Try to do a quiet activity, brush your teeth, and go to bed at the same time each night.  Get out of bed if you are still awake after 15 minutes of trying to sleep. Keep the lights down, but try reading or  doing a quiet activity. When you feel sleepy, go back to bed.  Make sure that you drive carefully. Avoid driving if you feel very sleepy.  Keep all follow-up appointments as directed by your health care provider. This is important. SEEK MEDICAL CARE IF:   You are tired throughout the day or have trouble in your daily routine due to sleepiness.  You continue to have sleep problems or your sleep problems get worse. SEEK IMMEDIATE MEDICAL CARE IF:   You have serious thoughts about hurting yourself or someone else.   This information is not intended to replace advice given to you by your health care provider. Make sure you discuss any questions you have with your health care provider.   Document Released: 03/12/2000 Document Revised: 12/04/2014 Document Reviewed: 12/14/2013 Elsevier Interactive Patient Education 2016 [employer] Inc.  

## 2015-12-02 ENCOUNTER — Emergency Department (HOSPITAL_BASED_OUTPATIENT_CLINIC_OR_DEPARTMENT_OTHER)
Admission: EM | Admit: 2015-12-02 | Discharge: 2015-12-02 | Disposition: A | Discharge status: Home / Self Care | Payer: Medicare HMO | Attending: Emergency Medicine | Admitting: Emergency Medicine

## 2015-12-02 ENCOUNTER — Encounter (HOSPITAL_BASED_OUTPATIENT_CLINIC_OR_DEPARTMENT_OTHER): Payer: Self-pay | Admitting: Emergency Medicine

## 2015-12-02 DIAGNOSIS — S29011A Strain of muscle and tendon of front wall of thorax, initial encounter: Secondary | ICD-10-CM | POA: Insufficient documentation

## 2015-12-02 DIAGNOSIS — S39012A Strain of muscle, fascia and tendon of lower back, initial encounter: Secondary | ICD-10-CM | POA: Diagnosis not present

## 2015-12-02 DIAGNOSIS — W109XXA Fall (on) (from) unspecified stairs and steps, initial encounter: Secondary | ICD-10-CM | POA: Insufficient documentation

## 2015-12-02 DIAGNOSIS — S3992XA Unspecified injury of lower back, initial encounter: Secondary | ICD-10-CM | POA: Diagnosis present

## 2015-12-02 DIAGNOSIS — Y939 Activity, unspecified: Secondary | ICD-10-CM | POA: Diagnosis not present

## 2015-12-02 DIAGNOSIS — Y929 Unspecified place or not applicable: Secondary | ICD-10-CM | POA: Diagnosis not present

## 2015-12-02 DIAGNOSIS — T148XXA Other injury of unspecified body region, initial encounter: Secondary | ICD-10-CM

## 2015-12-02 DIAGNOSIS — Z79899 Other long term (current) drug therapy: Secondary | ICD-10-CM | POA: Diagnosis not present

## 2015-12-02 DIAGNOSIS — S39011A Strain of muscle, fascia and tendon of abdomen, initial encounter: Secondary | ICD-10-CM | POA: Diagnosis not present

## 2015-12-02 DIAGNOSIS — Y999 Unspecified external cause status: Secondary | ICD-10-CM | POA: Diagnosis not present

## 2015-12-02 DIAGNOSIS — F17200 Nicotine dependence, unspecified, uncomplicated: Secondary | ICD-10-CM | POA: Insufficient documentation

## 2015-12-02 HISTORY — DX: Hyperlipidemia, unspecified: E78.5

## 2015-12-02 HISTORY — DX: Liver disease, unspecified: K76.9

## 2015-12-02 HISTORY — DX: Cocaine abuse, uncomplicated: F14.10

## 2015-12-02 HISTORY — DX: Chronic ischemic heart disease, unspecified: I25.9

## 2015-12-02 HISTORY — DX: Other psychoactive substance abuse, uncomplicated: F19.10

## 2015-12-02 HISTORY — DX: Malingerer (conscious simulation): Z76.5

## 2015-12-02 HISTORY — DX: Major depressive disorder, single episode, unspecified: F32.9

## 2015-12-02 MED ORDER — MORPHINE SULFATE 15 MG PO TABS: 15.0000 mg | ORAL_TABLET | Freq: Four times a day (QID) | ORAL | 0 refills | Status: DC | PRN

## 2015-12-02 MED ORDER — CYCLOBENZAPRINE HCL 10 MG PO TABS: 10.0000 mg | ORAL_TABLET | Freq: Two times a day (BID) | ORAL | 0 refills | Status: DC | PRN

## 2015-12-02 MED ORDER — KETOROLAC TROMETHAMINE 60 MG/2ML IM SOLN
60.0000 mg | Freq: Once | INTRAMUSCULAR | Status: AC
  Administered 2015-12-02: 60 mg via INTRAMUSCULAR
  Filled 2015-12-02: qty 2

## 2015-12-02 MED FILL — MORPHINE SULFATE IR 15 MG T: 15 | 3 days supply | Qty: 15 | Fill #0

## 2015-12-02 MED FILL — CYCLOBENZAPRINE 10 MG TAB: 10 | 10 days supply | Qty: 20 | Fill #0

## 2015-12-02 NOTE — ED Triage Notes (Addendum)
Pt states he fell 6 days ago on steps.  Pt hit his head.  Pt seen at [facility] the day of fall and on Friday at MD office.  Pt c/o pain to both shoulders, thoracic back area, anterior chest pain, posterior neck area, and headache.  Pt states all symptoms were present at time of fall but hasn't gone away.  Pt states he did have loc at time of fall.  Pt was going down steps and turned to see someone and fell.  Pt was at the top and was approx 25 steps.  Pt ambulating well.  No acute distress noted.  Pt noted he was out of pain medications.

## 2015-12-02 NOTE — ED Notes (Signed)
MD at bedside. 

## 2015-12-02 NOTE — ED Notes (Addendum)
Pt states he fell 1 week ago. Pt was seen at [facility] and had imaging done. Pt states he was given codeine for pain and has taken all of it. Pt states it did not work. Pt states chest and back pain persist. Pt moves all extremities well. Pt also seen by PCP on Friday for same.

## 2015-12-02 NOTE — ED Provider Notes (Signed)
[facility] Provider Note   CSN: [id number] Arrival date & time: 12/02/15  1141     History   Chief Complaint Chief Complaint  Patient presents with  . Fall    HPI Jared Rojas is a 60 y.o. male.  Patient is a 60 year old male with a prior history of mental illness, liver disease, polysubstance abuse presenting after a fall 6 days ago down the steps. He was seen at [facility] and at that time had CT scans done that showed multiple fractures of the face but no other injury. Patient was started on pain medication codeine 30 mg which she states did not help with the pain however he is no longer has the pain medication and states he is having significant discomfort in his neck, back, chest and abdomen. He says the pain is achy and constant. It is a 8 out of 10. He is unable to sleep because he is so uncomfortable. Stiffness seems to be worse in the morning. It is been there since the fall but seems to be not improving. He denies any shortness of breath, nausea, vomiting. No numbness or weakness in the extremities. Other than being seen there he denies any other recent prescriptions being filled.      Past Medical History:  Diagnosis Date  . Anxiety   . Drug abuse, cocaine type    Per Medical history [facility]  . Drug-seeking behavior    Per Medical history [facility]  . Hyperlipidemia    Per [facility] history  . Ischemic heart disease    Per Medical history [facility]  . Liver damage    Per Medical history [facility]  . 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 Malingering    Per Medical history [facility]  . Schizoaffective disorder   . Substance abuse    per [facility]    Patient Active Problem List   Diagnosis Date Noted  . INSOMNIA 04/30/2009  . INGROWN TOENAIL 04/03/2009  . NAUSEA WITH VOMITING 03/26/2009  .  FRACTURE, FINGER, DISTAL 02/18/2009  . LOW BLOOD PRESSURE 02/14/2009  . KNEE PAIN, RIGHT 11/21/2008  . 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 UNSPECIFIED ANEMIA 09/24/2008  . DYSPEPSIA 08/29/2008  . IMPAIRED FASTING GLUCOSE 05/29/2008  . ERECTILE DYSFUNCTION 01/05/2008  . SHORTNESS OF BREATH 11/16/2007  . UNSPECIFIED HEARING LOSS 10/24/2007  . LEG PAIN, CHRONIC 08/23/2007  . BENIGN PROSTATIC HYPERTROPHY, MILD, HX OF 08/15/2007  . HOARSENESS 06/01/2006  . HEPATITIS C 01/04/2006  . HYPERCHOLESTEROLEMIA 01/04/2006  . SCHIZOAFFECTIVE DISORDER 01/04/2006  . PSYCHOSIS, UNSPECIFIED 01/04/2006  . ANXIETY 01/04/2006  . TOBACCO DEPENDENCE 01/04/2006  . GASTROESOPHAGEAL REFLUX, NO ESOPHAGITIS 01/04/2006  . DIVERTICULOSIS OF COLON 01/04/2006  . 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 ABDOMINAL SURGERY    . APPENDECTOMY    . HIP SURGERY    . INNER EAR SURGERY    . PELVIS CLOSED REDUCTION    . WRIST SURGERY         Home Medications    Prior to Admission medications   Medication Sig Start Date End Date Taking? Authorizing Provider  lansoprazole (PREVACID) 30 MG capsule Take 30 mg by mouth daily at 12 noon.   Yes Historical Provider, MD  allopurinol (ZYLOPRIM) 300 MG tablet  10/19/11   Historical Provider, MD  cephALEXin (KEFLEX) 500 MG capsule 3 (three) times daily.  09/07/11  Historical Provider, MD  cyclobenzaprine (FLEXERIL) 10 MG tablet Take 1 tablet (10 mg total) by mouth 2 (two) times daily as needed for muscle spasms. 12/02/15   Gwyneth Sprout, MD  doxepin (SINEQUAN) 50 MG capsule Take 1 capsule (50 mg total) by mouth at bedtime. 07/06/11 07/05/12  Elaina Pattee, MD  doxylamine, Sleep, (UNISOM) 25 MG tablet Take 1 tablet (25 mg total) by mouth at bedtime as needed. 09/25/15   Arthor Captain, PA-C  esomeprazole (NEXIUM) 40 MG capsule Take 40 mg by mouth 2 (two) times daily.    Historical Provider, MD  Gabapentin, PHN, 600 MG TABS Take 600 mg by mouth  2 (two) times daily. 07/06/11   Elaina Pattee, MD  morphine (MSIR) 15 MG tablet Take 1 tablet (15 mg total) by mouth every 6 (six) hours as needed for severe pain. 12/02/15   Gwyneth Sprout, MD  OMEPRAZOLE PO Take by mouth.    Historical Provider, MD  oxyCODONE (ROXICODONE) 5 MG immediate release tablet Take 1 tablet (5 mg total) by mouth every 4 (four) hours as needed for pain. 03/19/12   Elson Areas, PA-C  promethazine (PHENERGAN) 25 MG tablet  10/08/11   Historical Provider, MD  risperiDONE (RISPERDAL) 0.5 MG tablet Take 1 tablet (0.5 mg total) by mouth at bedtime. 08/11/11 09/10/11  Elaina Pattee, MD  traMADol Janean Sark) 50 MG tablet  11/20/11   Historical Provider, MD  traZODone (DESYREL) 100 MG tablet Take 1 to 2 at bedtime as needed for sleep 12/09/11 01/08/12  Elaina Pattee, MD    Family History Family History  Problem Relation Age of Onset  . Anxiety disorder Sister   . Depression Sister     Social History Social History  Substance Use Topics  . Smoking status: Current Every Day Smoker    Packs/day: 1.00  . Smokeless tobacco: Never Used  . Alcohol use No     Allergies   Aspirin; Ibuprofen; Naproxen; and Tylenol [acetaminophen]   Review of Systems Review of Systems  All other systems reviewed and are negative.    Physical Exam Updated Vital Signs BP 121/96 (BP Location: Right Arm)   Pulse 62   Temp 98 F (36.7 C) (Oral)   Resp 16   Ht 5\' 3"  (1.6 m)   Wt 164 lb (74.4 kg)   SpO2 100%   BMI 29.05 kg/m   Physical Exam  Constitutional: He is oriented to person, place, and time. He appears well-developed and well-nourished. No distress.  HENT:  Head: Normocephalic.  Mouth/Throat: Oropharynx is clear and moist.  Healing ecchymosis around the right eye with swelling. Healing laceration above the right eyebrow  Eyes: Conjunctivae and EOM are normal. Pupils are equal, round, and reactive to light.  Neck: Normal range of motion. Neck supple.  Cardiovascular: Normal rate,  regular rhythm and intact distal pulses.   No murmur heard. Pulmonary/Chest: Effort normal and breath sounds normal. No respiratory distress. He has no wheezes. He has no rales. He exhibits tenderness and bony tenderness. He exhibits no crepitus, no edema and no swelling.    Abdominal: Soft. He exhibits no distension. There is no tenderness. There is no rebound and no guarding.  Musculoskeletal: Normal range of motion. He exhibits no edema.       Cervical back: He exhibits tenderness and bony tenderness. He exhibits normal range of motion.       Thoracic back: He exhibits tenderness, pain and spasm. He exhibits normal range of motion and  no bony tenderness.       Lumbar back: He exhibits tenderness, pain and spasm. He exhibits normal range of motion, no bony tenderness and no deformity.  Neurological: He is alert and oriented to person, place, and time. He has normal strength. No cranial nerve deficit or sensory deficit. Gait normal.  Skin: Skin is warm and dry. No rash noted. No erythema.  Psychiatric: He has a normal mood and affect. His behavior is normal.  Nursing note and vitals reviewed.    ED Treatments / Results  Labs (all labs ordered are listed, but only abnormal results are displayed) Labs Reviewed - No data to display  EKG  EKG Interpretation None       Radiology No results found.  Procedures Procedures (including critical care time)  Medications Ordered in ED Medications  ketorolac (TORADOL) injection 60 mg (60 mg Intramuscular Given 12/02/15 1250)     Initial Impression / Assessment and Plan / ED Course  I have reviewed the triage vital signs and the nursing notes.  Pertinent labs & imaging results that were available during my care of the patient were reviewed by me and considered in my medical decision making (see chart for details).  Clinical Course   Patient with mechanical fall down 25 steps approximately 6 days ago. He is presenting today He's having  ongoing back, chest and abdominal tenderness. He was initially seen the day of the fall for [facility] and that time had CT scan of the head, neck and face which showed an orbital blowout fracture but no intracranial or cervical injury. He also had a chest x-ray which was within normal limits. Patient has no focal tenderness today to suggest rib fracture or intra-abdominal injury. He has diffuse muscular back tenderness which feel is all related to the fall. He was taking codeine as prescribed but states it was not helping. He says he is unable to take ibuprofen and Tylenol because it causes him to vomit. He otherwise is well-appearing and has normal vital signs. He has no neurologic issues and he is able to ambulate without difficulty. Patient was treated with Toradol which he states he had had before and did well with. He was given prescription for muscle relaxer and a small prescription for pain medication. Looking on the database patient had only filled 1 prescription in the last 6 months and that was for codeine 6 days ago for his initial presentation.  Final Clinical Impressions(s) / ED Diagnoses   Final diagnoses:  Contusion  Muscle strain    New Prescriptions Discharge Medication List as of 12/02/2015 12:48 PM    START taking these medications   Details  cyclobenzaprine (FLEXERIL) 10 MG tablet Take 1 tablet (10 mg total) by mouth 2 (two) times daily as needed for muscle spasms., Starting Tue 12/02/2015, Print    morphine (MSIR) 15 MG tablet Take 1 tablet (15 mg total) by mouth every 6 (six) hours as needed for severe pain., Starting Tue 12/02/2015, Print         Gwyneth SproutWhitney Aalaysia Liggins, MD 12/02/15 [phone]

## 2016-02-15 ENCOUNTER — Emergency Department (HOSPITAL_BASED_OUTPATIENT_CLINIC_OR_DEPARTMENT_OTHER): Payer: Medicare HMO

## 2016-02-15 ENCOUNTER — Encounter (HOSPITAL_BASED_OUTPATIENT_CLINIC_OR_DEPARTMENT_OTHER): Payer: Self-pay | Admitting: *Deleted

## 2016-02-15 ENCOUNTER — Emergency Department (HOSPITAL_BASED_OUTPATIENT_CLINIC_OR_DEPARTMENT_OTHER)
Admission: EM | Admit: 2016-02-15 | Discharge: 2016-02-15 | Disposition: A | Discharge status: Home / Self Care | Payer: Medicare HMO | Attending: Emergency Medicine | Admitting: Emergency Medicine

## 2016-02-15 DIAGNOSIS — Z765 Malingerer [conscious simulation]: Secondary | ICD-10-CM

## 2016-02-15 DIAGNOSIS — S83412A Sprain of medial collateral ligament of left knee, initial encounter: Secondary | ICD-10-CM | POA: Diagnosis not present

## 2016-02-15 DIAGNOSIS — Z7289 Other problems related to lifestyle: Secondary | ICD-10-CM | POA: Insufficient documentation

## 2016-02-15 DIAGNOSIS — Y999 Unspecified external cause status: Secondary | ICD-10-CM | POA: Diagnosis not present

## 2016-02-15 DIAGNOSIS — S62501A Fracture of unspecified phalanx of right thumb, initial encounter for closed fracture: Secondary | ICD-10-CM

## 2016-02-15 DIAGNOSIS — Y92410 Unspecified street and highway as the place of occurrence of the external cause: Secondary | ICD-10-CM | POA: Insufficient documentation

## 2016-02-15 DIAGNOSIS — S6991XA Unspecified injury of right wrist, hand and finger(s), initial encounter: Secondary | ICD-10-CM | POA: Diagnosis present

## 2016-02-15 DIAGNOSIS — S39012A Strain of muscle, fascia and tendon of lower back, initial encounter: Secondary | ICD-10-CM | POA: Diagnosis not present

## 2016-02-15 DIAGNOSIS — S0031XA Abrasion of nose, initial encounter: Secondary | ICD-10-CM | POA: Diagnosis not present

## 2016-02-15 DIAGNOSIS — Y9355 Activity, bike riding: Secondary | ICD-10-CM | POA: Diagnosis not present

## 2016-02-15 DIAGNOSIS — S161XXA Strain of muscle, fascia and tendon at neck level, initial encounter: Secondary | ICD-10-CM | POA: Diagnosis not present

## 2016-02-15 DIAGNOSIS — S62514A Nondisplaced fracture of proximal phalanx of right thumb, initial encounter for closed fracture: Secondary | ICD-10-CM | POA: Insufficient documentation

## 2016-02-15 DIAGNOSIS — F17200 Nicotine dependence, unspecified, uncomplicated: Secondary | ICD-10-CM | POA: Diagnosis not present
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
## 2016-02-15 NOTE — ED Notes (Signed)
Returned from xray

## 2016-02-15 NOTE — ED Notes (Addendum)
MD with pt  

## 2016-02-15 NOTE — ED Notes (Signed)
Ice pack given to pt at d/c home. Splint placed to right thumb. Tolerated well.

## 2016-02-15 NOTE — ED Notes (Signed)
Transported to xray 

## 2016-02-15 NOTE — ED Notes (Addendum)
Verbalizes understanding, states, "have used before", "feels better now with immobilizer in place". CMS intact.
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
## 2018-03-05 ENCOUNTER — Encounter (HOSPITAL_BASED_OUTPATIENT_CLINIC_OR_DEPARTMENT_OTHER): Payer: Self-pay

## 2018-03-05 ENCOUNTER — Other Ambulatory Visit: Payer: Self-pay

## 2018-03-05 ENCOUNTER — Emergency Department (HOSPITAL_BASED_OUTPATIENT_CLINIC_OR_DEPARTMENT_OTHER)
Admission: EM | Admit: 2018-03-05 | Discharge: 2018-03-06 | Disposition: A | Discharge status: Home / Self Care | Payer: Medicare HMO | Attending: Emergency Medicine | Admitting: Emergency Medicine

## 2018-03-05 DIAGNOSIS — F259 Schizoaffective disorder, unspecified: Secondary | ICD-10-CM | POA: Insufficient documentation

## 2018-03-05 DIAGNOSIS — F329 Major depressive disorder, single episode, unspecified: Secondary | ICD-10-CM | POA: Diagnosis not present

## 2018-03-05 DIAGNOSIS — M542 Cervicalgia: Secondary | ICD-10-CM | POA: Diagnosis not present

## 2018-03-05 DIAGNOSIS — F17200 Nicotine dependence, unspecified, uncomplicated: Secondary | ICD-10-CM | POA: Insufficient documentation

## 2018-03-05 DIAGNOSIS — F419 Anxiety disorder, unspecified: Secondary | ICD-10-CM | POA: Diagnosis not present

## 2018-03-05 DIAGNOSIS — Z79899 Other long term (current) drug therapy: Secondary | ICD-10-CM | POA: Diagnosis not present

## 2018-03-05 DIAGNOSIS — F1410 Cocaine abuse, uncomplicated: Secondary | ICD-10-CM | POA: Diagnosis not present

## 2018-03-05 DIAGNOSIS — R42 Dizziness and giddiness: Secondary | ICD-10-CM | POA: Diagnosis not present

## 2018-03-05 DIAGNOSIS — G8929 Other chronic pain: Secondary | ICD-10-CM

## 2018-03-05 NOTE — ED Triage Notes (Signed)
Pt c/o neck pain, dizziness when he woke up. Pt states this is a chronic pain since 2009, but it is worse today. Pt states the dizziness is worse when he turns his head.

## 2018-03-06 DIAGNOSIS — M542 Cervicalgia: Secondary | ICD-10-CM | POA: Diagnosis not present

## 2018-03-06 MED ORDER — DIAZEPAM 5 MG PO TABS
5.0000 mg | ORAL_TABLET | Freq: Once | ORAL | Status: AC
  Administered 2018-03-06: 5 mg via ORAL
  Filled 2018-03-06: qty 1

## 2018-03-06 MED ORDER — KETOROLAC TROMETHAMINE 60 MG/2ML IM SOLN
15.0000 mg | Freq: Once | INTRAMUSCULAR | Status: AC
  Administered 2018-03-06: 15 mg via INTRAMUSCULAR
  Filled 2018-03-06: qty 2

## 2018-03-06 NOTE — ED Notes (Signed)
Pt understood dc material. NAD noted. 

## 2018-03-06 NOTE — Discharge Instructions (Addendum)
You were seen today for chronic neck pain and dizziness.  No imaging was obtained given you have had the symptoms previously.  Your pain resolved with muscle relaxation and anti-inflammatories.  Anti-inflammatories like Aleve.  Follow-up with your surgeon if symptoms continue.  If you develop strokelike symptoms, weakness, numbness, any new or worsening symptoms you should be reevaluated.

## 2018-03-06 NOTE — ED Provider Notes (Addendum)
[facility] EMERGENCY DEPARTMENT Provider Note   CSN: [id number] Arrival date & time: 03/05/18  2343     History   Chief Complaint Chief Complaint  Patient presents with  . Neck Pain    HPI Jared Rojas is a 62 y.o. male.  HPI  This is a 62 year old male with a history of chronic neck pain and documented substance abuse who presents with neck pain and dizziness.  Patient reports he has had neck pain intermittently and ongoing since 2009 when he was injured.  During episodes of pain he typically has dizziness.  He describes the dizziness as both lightheadedness and room spinning.  He is not currently taking anything for his pain.  He states tonight he had an episode that was "worse than my normal."  He reports lower cervical neck pain.  Rates his pain at 10 out of 10.  He reports dizziness that is worse with range of motion of his neck.  He denies any recent injury or chiropractic manipulation.  Denies any weakness, numbness, speech difficulty, strokelike symptoms.  Denies any numbness or tingling in the arms.  Patient states that his pain often keeps him from sleeping at night.  Past Medical History:  Diagnosis Date  . Anxiety   . Drug abuse, cocaine type [facility](HCC)    Per Medical history [facility]  . Drug-seeking behavior    Per Medical history [facility]  . Hyperlipidemia    Per [facility] history  . Ischemic heart disease    Per Medical history [facility]  . Liver damage    Per Medical history [facility]  . Major depressive disorder without psychotic features    [facility]  . Malingering    Per Medical history [facility]  . Schizoaffective disorder   . Substance abuse (HCC)    [facility]    Patient Active Problem List   Diagnosis Date Noted  . INSOMNIA 04/30/2009  . INGROWN TOENAIL 04/03/2009  . NAUSEA WITH VOMITING 03/26/2009  . FRACTURE, FINGER, DISTAL  02/18/2009  . LOW BLOOD PRESSURE 02/14/2009  . KNEE PAIN, RIGHT 11/21/2008  . 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 UNSPECIFIED ANEMIA 09/24/2008  . DYSPEPSIA 08/29/2008  . IMPAIRED FASTING GLUCOSE 05/29/2008  . ERECTILE DYSFUNCTION 01/05/2008  . SHORTNESS OF BREATH 11/16/2007  . UNSPECIFIED HEARING LOSS 10/24/2007  . LEG PAIN, CHRONIC 08/23/2007  . BENIGN PROSTATIC HYPERTROPHY, MILD, HX OF 08/15/2007  . HOARSENESS 06/01/2006  . HEPATITIS C 01/04/2006  . HYPERCHOLESTEROLEMIA 01/04/2006  . SCHIZOAFFECTIVE DISORDER 01/04/2006  . PSYCHOSIS, UNSPECIFIED 01/04/2006  . ANXIETY 01/04/2006  . TOBACCO DEPENDENCE 01/04/2006  . GASTROESOPHAGEAL REFLUX, NO ESOPHAGITIS 01/04/2006  . DIVERTICULOSIS OF COLON 01/04/2006  . 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 ABDOMINAL SURGERY    . APPENDECTOMY    . HIP SURGERY    . INNER EAR SURGERY    . PELVIS CLOSED REDUCTION    . WRIST SURGERY          Home Medications    Prior to Admission medications   Medication Sig Start Date End Date Taking? Authorizing Provider  lansoprazole (PREVACID) 30 MG capsule Take 30 mg by mouth daily at 12 noon.    [provider]    Family History Family History  Problem Relation Age of Onset  . Anxiety disorder Sister   . Depression Sister     Social History Social History   Tobacco Use  .  Smoking status: Current Every Day Smoker    Packs/day: 1.00  . Smokeless tobacco: Never Used  Substance Use Topics  . Alcohol use: Not Currently  . Drug use: Yes    Types: Marijuana, Cocaine    Comment: History of drug use     Allergies   Aspirin; Ibuprofen; Morphine and related; Naproxen; and Tylenol [acetaminophen]   Review of Systems Review of Systems  Constitutional: Negative for fever.  Respiratory: Negative for shortness of breath.   Cardiovascular: Negative for chest pain.  Musculoskeletal: Positive for neck pain. Negative for neck stiffness.    Neurological: Positive for dizziness and light-headedness. Negative for weakness, numbness and headaches.  All other systems reviewed and are negative.    Physical Exam Updated Vital Signs BP 110/79   Pulse (!) 58 Comment: nurse notified of pulse jump.   Temp 98.1 F (36.7 C)   Resp 18   Ht 1.6 m (5\' 3" )   Wt 72.6 kg   SpO2 98%   BMI 28.34 kg/m   Physical Exam  Constitutional: He is oriented to person, place, and time.  Chronically ill-appearing, nontoxic, no acute distress  HENT:  Head: Normocephalic and atraumatic.  Eyes: Pupils are equal, round, and reactive to light. EOM are normal.  No nystagmus noted  Neck: Normal range of motion. Neck supple.  Tenderness to palpation lower cervical spine, no step-off or deformity noted  Cardiovascular: Normal rate, regular rhythm and normal heart sounds.  No murmur heard. Pulmonary/Chest: Effort normal and breath sounds normal. No respiratory distress. He has no wheezes.  Abdominal: Soft. There is no tenderness. There is no rebound.  Musculoskeletal: He exhibits no edema.  Neurological: He is alert and oriented to person, place, and time.  Radial nerves II through XII intact, 5 out of 5 strength in all 4 extremities, no dysmetria to finger-nose-finger, normal gait  Skin: Skin is warm and dry.  Psychiatric: He has a normal mood and affect.  Nursing note and vitals reviewed.    ED Treatments / Results  Labs (all labs ordered are listed, but only abnormal results are displayed) Labs Reviewed - No data to display  EKG None  Radiology No results found.  Procedures Procedures (including critical care time)  Medications Ordered in ED Medications  diazepam (VALIUM) tablet 5 mg (5 mg Oral Given 03/06/18 0110)  ketorolac (TORADOL) injection 15 mg (15 mg Intramuscular Given 03/06/18 0110)     Initial Impression / Assessment and Plan / ED Course  I have reviewed the triage vital signs and the nursing notes.  Pertinent labs &  imaging results that were available during my care of the patient were reviewed by me and considered in my medical decision making (see chart for details).     Patient presents with neck pain.  Reports chronic neck pain but worsening tonight.  Also reports dizziness but again this is not new for him.  He is overall nontoxic-appearing and vital signs are reassuring.  His neurologic exam is normal.  At this time no evidence of cerebellar dysfunction.  Given that his symptoms are characteristic of his chronic pain exacerbations, would not pursue further imaging at this time with reassuring neurologic exam.  Doubt acute dissection or stroke.  Patient was given Valium and a low-dose of Toradol.  On recheck, he states he feels much better and is at his baseline.  He has multiple notes in his chart regarding drug-seeking behavior and prior substance abuse.  Will avoid giving any narcotics or addictive  medications at discharge.  We will have him follow-up with his specialist for ongoing issues.  After history, exam, and medical workup I feel the patient has been appropriately medically screened and is safe for discharge home. Pertinent diagnoses were discussed with the patient. Patient was given return precautions.   Final Clinical Impressions(s) / ED Diagnoses   Final diagnoses:  Chronic neck pain  Dizziness    ED Discharge Orders    None       Emmersyn Kratzke, Mayer Masker, MD 03/06/18 1610    Shon Baton, MD 03/06/18 [phone]
